# Patient Record
Sex: Female | Born: 1990 | Race: White | Hispanic: No | Marital: Single | Smoking: Former smoker
Health system: Southern US, Community
[De-identification: ages and names within clinical notes are randomized; demographics above are authoritative.]

## PROBLEM LIST (undated history)

## (undated) ENCOUNTER — Inpatient Hospital Stay (HOSPITAL_COMMUNITY): Payer: Self-pay

## (undated) DIAGNOSIS — O26872 Cervical shortening, second trimester: Secondary | ICD-10-CM

## (undated) DIAGNOSIS — Z789 Other specified health status: Secondary | ICD-10-CM

## (undated) HISTORY — PX: TONSILLECTOMY: SUR1361

## (undated) HISTORY — DX: Cervical shortening, second trimester: O26.872

## (undated) HISTORY — DX: Other specified health status: Z78.9

---

## 2016-04-13 HISTORY — PX: DILATION AND CURETTAGE OF UTERUS: SHX78

## 2017-07-04 LAB — OB RESULTS CONSOLE ANTIBODY SCREEN: Antibody Screen: NEGATIVE

## 2017-07-04 LAB — OB RESULTS CONSOLE ABO/RH

## 2017-07-04 LAB — OB RESULTS CONSOLE PLATELET COUNT: PLATELETS: 296

## 2017-07-04 LAB — OB RESULTS CONSOLE GC/CHLAMYDIA
Chlamydia: NEGATIVE
Gonorrhea: NEGATIVE

## 2017-07-04 LAB — OB RESULTS CONSOLE HIV ANTIBODY (ROUTINE TESTING): HIV: NONREACTIVE

## 2017-07-04 LAB — OB RESULTS CONSOLE HGB/HCT, BLOOD
HCT: 37
Hemoglobin: 12.7

## 2017-07-04 LAB — OB RESULTS CONSOLE HEPATITIS B SURFACE ANTIGEN: Hepatitis B Surface Ag: NEGATIVE

## 2017-07-04 LAB — CULTURE, OB URINE: Urine Culture, OB: NEGATIVE

## 2017-07-04 LAB — OB RESULTS CONSOLE RUBELLA ANTIBODY, IGM: Rubella: IMMUNE

## 2017-09-28 ENCOUNTER — Inpatient Hospital Stay (HOSPITAL_COMMUNITY)
Admission: AD | Admit: 2017-09-28 | Discharge: 2017-09-28 | Disposition: A | Discharge status: Home / Self Care | Payer: Self-pay | Source: Ambulatory Visit | Attending: Obstetrics & Gynecology | Admitting: Obstetrics & Gynecology

## 2017-09-28 ENCOUNTER — Encounter (HOSPITAL_COMMUNITY): Payer: Self-pay | Admitting: *Deleted

## 2017-09-28 ENCOUNTER — Other Ambulatory Visit: Payer: Self-pay

## 2017-09-28 ENCOUNTER — Inpatient Hospital Stay (HOSPITAL_COMMUNITY): Payer: Self-pay

## 2017-09-28 DIAGNOSIS — O26892 Other specified pregnancy related conditions, second trimester: Secondary | ICD-10-CM | POA: Insufficient documentation

## 2017-09-28 DIAGNOSIS — O26899 Other specified pregnancy related conditions, unspecified trimester: Secondary | ICD-10-CM

## 2017-09-28 DIAGNOSIS — R109 Unspecified abdominal pain: Secondary | ICD-10-CM | POA: Insufficient documentation

## 2017-09-28 DIAGNOSIS — Z3A22 22 weeks gestation of pregnancy: Secondary | ICD-10-CM | POA: Insufficient documentation

## 2017-09-28 DIAGNOSIS — O26872 Cervical shortening, second trimester: Secondary | ICD-10-CM

## 2017-09-28 LAB — URINALYSIS, ROUTINE W REFLEX MICROSCOPIC
Bilirubin Urine: NEGATIVE
GLUCOSE, UA: NEGATIVE mg/dL
Hgb urine dipstick: NEGATIVE
Ketones, ur: NEGATIVE mg/dL
Nitrite: NEGATIVE
Protein, ur: NEGATIVE mg/dL
SPECIFIC GRAVITY, URINE: 1.017 (ref 1.005–1.030)
pH: 5 (ref 5.0–8.0)

## 2017-09-28 NOTE — MAU Provider Note (Signed)
History     CSN: [id number]  Arrival date and time: 09/28/17 1191   First Provider Initiated Contact with Patient 09/28/17 1925      Chief Complaint  Patient presents with  . Abdominal Pain  . Back Pain  . Pelvic Pain   HPI  Kathlyne Loud is a 27 y.o. G2P0010 at [redacted]w[redacted]d who presents with abdominal pain. She recently moved her from [place] (at Christmas time) & has not started prenatal care locally. Reports getting routine prenatal care in [facility] [place] & denies complications of pregnancy so far.  Current symptoms began 3-4 days ago. Reports lower abdominal "heaviness" that is worse with voiding & abdominal cramping. Rates pain 5/10. Has not treated symptoms. Denies n/v/d, fever/chills, dysuria, hematuria, vaginal bleeding, vaginal discharge, or LOF.   Hx of 1st trimester TAB in 2017, ended up with D&C d/t retained products.   OB History    Gravida Para Term Preterm AB Living   2       1     SAB TAB Ectopic Multiple Live Births     1            History reviewed. No pertinent past medical history.  Past Surgical History:  Procedure Laterality Date  . DILATION AND CURETTAGE OF UTERUS  04/2016   RPOC from TAB    History reviewed. No pertinent family history.  Social History   Tobacco Use  . Smoking status: Former Games developer  . Smokeless tobacco: Former Engineer, water Use Topics  . Alcohol use: No    Frequency: Never  . Drug use: No    Allergies: Not on File  No medications prior to admission.    Review of Systems  Constitutional: Negative.   Gastrointestinal: Positive for abdominal pain. Negative for constipation, diarrhea, nausea and vomiting.  Genitourinary: Positive for pelvic pain. Negative for dysuria, flank pain, frequency, hematuria, vaginal bleeding and vaginal discharge.  Musculoskeletal: Positive for back pain.   Physical Exam   Blood pressure 110/85, pulse 91, temperature 98.6 F (37 C), temperature source Oral, resp. rate 16, height 5\' 1"  (1.549  m), weight 148 lb (67.1 kg), SpO2 100 %.  Physical Exam  Nursing note and vitals reviewed. Constitutional: She is oriented to person, place, and time. She appears well-developed and well-nourished. No distress.  HENT:  Head: Normocephalic and atraumatic.  Eyes: Conjunctivae are normal. Right eye exhibits no discharge. Left eye exhibits no discharge. No scleral icterus.  Neck: Normal range of motion.  Respiratory: Effort normal. No respiratory distress.  GI: Soft. There is no CVA tenderness.  Genitourinary:  Genitourinary Comments: SVE, ext fingertip, soft  Neurological: She is alert and oriented to person, place, and time.  Skin: Skin is warm and dry. She is not diaphoretic.  Psychiatric: She has a normal mood and affect. Her behavior is normal. Judgment and thought content normal.    MAU Course  Procedures Results for orders placed or performed during the hospital encounter of 09/28/17 (from the past 24 hour(s))  Urinalysis, Routine w reflex microscopic     Status: Abnormal   Collection Time: 09/28/17  6:55 PM  Result Value Ref Range   Color, Urine YELLOW YELLOW   APPearance HAZY (A) CLEAR   Specific Gravity, Urine 1.017 1.005 - 1.030   pH 5.0 5.0 - 8.0   Glucose, UA NEGATIVE NEGATIVE mg/dL   Hgb urine dipstick NEGATIVE NEGATIVE   Bilirubin Urine NEGATIVE NEGATIVE   Ketones, ur NEGATIVE NEGATIVE mg/dL   Protein, ur NEGATIVE  NEGATIVE mg/dL   Nitrite NEGATIVE NEGATIVE   Leukocytes, UA SMALL (A) NEGATIVE   RBC / HPF 0-5 0 - 5 RBC/hpf   WBC, UA 0-5 0 - 5 WBC/hpf   Bacteria, UA RARE (A) NONE SEEN   Squamous Epithelial / LPF 6-30 (A) NONE SEEN   Mucus PRESENT    [place] Mfm Ob Transvaginal  Result Date: 09/29/2017 ----------------------------------------------------------------------  OBSTETRICS REPORT                      (Signed Final 09/29/2017 08:41 am) ---------------------------------------------------------------------- Patient Info  ID #:       [id number]                           D.O.B.:  [birth year] (26 yrs)  Name:       Clemon ChambersSAMANTHA Wedel                Visit Date: 09/28/2017 08:29 pm ---------------------------------------------------------------------- Performed By  Performed By:     Ellin SabaSusan M Kennedy        Referred By:      MAU Nursing-                    RDMS                                     MAU/Triage  Attending:        Ledon SnareBrian Brost MD         [facility] ---------------------------------------------------------------------- Orders   #  Description                                 Code   1  US MFM OB TRANSVAGINAL                      [id number]  ----------------------------------------------------------------------   #  Ordered By               Order #        Accession #    Episode #   1  Judeth HornERIN Kasen Adduci            [id number]      [id number]     [id number]  ---------------------------------------------------------------------- Indications   [redacted] weeks gestation of pregnancy                Z3A.22   Abdominal pain in pregnancy                    O99.89  ---------------------------------------------------------------------- OB History  Gravidity:    2         Term:   0  TOP:          1        Living:  0 ---------------------------------------------------------------------- Fetal Evaluation  Num Of Fetuses:     1  Fetal Heart         148  Rate(bpm):  Cardiac Activity:   Observed  Presentation:       Breech  Amniotic Fluid  AFI FV:      Subjectively within normal limits ---------------------------------------------------------------------- Gestational Age  Clinical EDD:  22w 2d  EDD:   01/30/18  Best:          Maudie Mercury 2d     Det. By:  Clinical EDD             EDD:   01/30/18 ---------------------------------------------------------------------- Cervix Uterus Adnexa  Cervix  Length:            2.5  cm.  Measured transvaginally. ---------------------------------------------------------------------- Impression  Singleton intrauterine pregnancy at  22 weeks 2 days  gestation with fetal cardiac activity  Breech presentation  Normal appearing amniotic fluid volume  No sonographic evidence of intrauterine bleed  Cervical length of 2.5 cm ---------------------------------------------------------------------- Recommendations  Follow-up ultrasounds as clinically indicated. ----------------------------------------------------------------------                   Ledon Snare, MD Electronically Signed Final Report   09/29/2017 08:41 am ----------------------------------------------------------------------    MDM FHT 144 Ultrasound shows CL 2.5 cm C/w Dr. Despina Hidden. Will get CL in 1 week.  Assessment and Plan  A: 1. Short cervical length during pregnancy in second trimester   2. [redacted] weeks gestation of pregnancy   3. Abdominal pain affecting pregnancy    P: Discharge home Discussed reasons to return to MAU Outpatient u/s ordered to recheck CL in 1 week Msg to [facility] for prenatal care  Judeth Horn 09/28/2017, 8:10 PM

## 2017-09-28 NOTE — Discharge Instructions (Signed)

## 2017-09-28 NOTE — MAU Note (Signed)
Pt reports for the last 3 days she has had pain on right side, pressure/heaviness  in lower abd

## 2017-09-29 ENCOUNTER — Encounter (HOSPITAL_COMMUNITY): Payer: Self-pay | Admitting: Student

## 2017-09-30 LAB — CULTURE, OB URINE: Culture: NO GROWTH

## 2017-10-01 ENCOUNTER — Inpatient Hospital Stay (HOSPITAL_COMMUNITY)
Admission: AD | Admit: 2017-10-01 | Discharge: 2017-10-01 | Disposition: A | Discharge status: Home / Self Care | Payer: Medicaid Other | Source: Ambulatory Visit | Attending: Obstetrics and Gynecology | Admitting: Obstetrics and Gynecology

## 2017-10-01 DIAGNOSIS — R109 Unspecified abdominal pain: Secondary | ICD-10-CM | POA: Insufficient documentation

## 2017-10-01 DIAGNOSIS — Z3A22 22 weeks gestation of pregnancy: Secondary | ICD-10-CM | POA: Insufficient documentation

## 2017-10-01 DIAGNOSIS — O26892 Other specified pregnancy related conditions, second trimester: Secondary | ICD-10-CM | POA: Diagnosis not present

## 2017-10-01 DIAGNOSIS — Z87891 Personal history of nicotine dependence: Secondary | ICD-10-CM | POA: Insufficient documentation

## 2017-10-01 LAB — URINALYSIS, ROUTINE W REFLEX MICROSCOPIC
BILIRUBIN URINE: NEGATIVE
Glucose, UA: 50 mg/dL — AB
Hgb urine dipstick: NEGATIVE
KETONES UR: NEGATIVE mg/dL
Nitrite: NEGATIVE
Protein, ur: NEGATIVE mg/dL
Specific Gravity, Urine: 1.002 — ABNORMAL LOW (ref 1.005–1.030)
pH: 7 (ref 5.0–8.0)

## 2017-10-01 MED ORDER — OXYTOCIN 40 UNITS IN LACTATED RINGERS INFUSION - SIMPLE MED
INTRAVENOUS | Status: AC
  Filled 2017-10-01: qty 1000

## 2017-10-01 NOTE — Discharge Instructions (Signed)

## 2017-10-01 NOTE — MAU Provider Note (Signed)
History   G2P0010 @ 22.5 wks in with persistant low abd pain that has been going on for 4 days now. Denies contractions or ROM. Denies vag bleeding. Pt verbalizes concern regarding preterm labor.  CSN: [id number]  Arrival date & time 10/01/17  1120   None     Chief Complaint  Patient presents with  . Abdominal Pain    HPI  No past medical history on file.  Past Surgical History:  Procedure Laterality Date  . DILATION AND CURETTAGE OF UTERUS  04/2016   RPOC from TAB    No family history on file.  Social History   Tobacco Use  . Smoking status: Former Games developermoker  . Smokeless tobacco: Former Engineer, waterUser  Substance Use Topics  . Alcohol use: No    Frequency: Never  . Drug use: No    OB History    Gravida Para Term Preterm AB Living   2       1     SAB TAB Ectopic Multiple Live Births     1            Obstetric Comments   G1- first trimester TAB, ended up with D&C d/t retained POC      Review of Systems  Constitutional: Negative.   HENT: Negative.   Eyes: Negative.   Respiratory: Negative.   Cardiovascular: Negative.   Gastrointestinal: Positive for abdominal pain.  Endocrine: Negative.   Genitourinary: Negative.   Musculoskeletal: Negative.   Skin: Negative.   Allergic/Immunologic: Negative.   Neurological: Negative.   Hematological: Negative.   Psychiatric/Behavioral: Negative.     Allergies  Patient has no allergy information on record.  Home Medications    BP (!) 105/44   Pulse 97   Temp 98.7 F (37.1 C)   Resp 16   Physical Exam  Constitutional: She is oriented to person, place, and time. She appears well-developed and well-nourished.  HENT:  Head: Normocephalic.  Eyes: Pupils are equal, round, and reactive to light.  Neck: Normal range of motion.  Cardiovascular: Normal rate, regular rhythm, normal heart sounds and intact distal pulses.  Pulmonary/Chest: Effort normal and breath sounds normal.  Abdominal: Soft. Bowel sounds are normal.   Genitourinary: Vagina normal and uterus normal.  Musculoskeletal: Normal range of motion.  Neurological: She is alert and oriented to person, place, and time. She has normal reflexes.  Skin: Skin is warm and dry.  Psychiatric: She has a normal mood and affect. Her behavior is normal. Judgment and thought content normal.    MAU Course  Procedures (including critical care time)  Labs Reviewed  URINALYSIS, ROUTINE W REFLEX MICROSCOPIC   No results found.   1. Abdominal pain in pregnancy, second trimester       MDM  VSS, FHR 155 st and reg with doppler. SVE firm/cl/post/high. Discussed normal discomforts of preg with pt. She was reassured. Will d/c home

## 2017-10-01 NOTE — MAU Note (Signed)
Patient had returned to MAU with right side abdominal pain was evaluated for this on Wednesday, denies vaginal bleeding, rates pain 4/10

## 2017-10-05 ENCOUNTER — Ambulatory Visit: Payer: Self-pay | Admitting: General Practice

## 2017-10-05 ENCOUNTER — Ambulatory Visit (HOSPITAL_COMMUNITY)
Admission: RE | Admit: 2017-10-05 | Discharge: 2017-10-05 | Disposition: A | Discharge status: Home / Self Care | Payer: Self-pay | Source: Ambulatory Visit | Attending: Student | Admitting: Student

## 2017-10-05 DIAGNOSIS — O26872 Cervical shortening, second trimester: Secondary | ICD-10-CM | POA: Insufficient documentation

## 2017-10-05 DIAGNOSIS — O26899 Other specified pregnancy related conditions, unspecified trimester: Secondary | ICD-10-CM | POA: Insufficient documentation

## 2017-10-05 DIAGNOSIS — R109 Unspecified abdominal pain: Secondary | ICD-10-CM | POA: Insufficient documentation

## 2017-10-05 DIAGNOSIS — Z3A23 23 weeks gestation of pregnancy: Secondary | ICD-10-CM | POA: Insufficient documentation

## 2017-10-05 DIAGNOSIS — Z3A22 22 weeks gestation of pregnancy: Secondary | ICD-10-CM

## 2017-10-05 MED ORDER — PROGESTERONE MICRONIZED 200 MG PO CAPS: 200.0000 mg | ORAL_CAPSULE | Freq: Every day | ORAL | 6 refills | Status: DC

## 2017-10-05 NOTE — Progress Notes (Signed)
Patient came down from MFM for results. Reviewed ultrasound results with patient. Per Dr Earlene Plateravis, patient needs new OB appt asap & start prometrium 200mg  vaginally each night. Med ordered and patient informed. Told patient to make new OB appt here as soon as possible. Patient verbalized understanding to all & had no questions

## 2017-10-06 ENCOUNTER — Encounter: Payer: Self-pay | Admitting: Student

## 2017-10-06 DIAGNOSIS — O26872 Cervical shortening, second trimester: Secondary | ICD-10-CM | POA: Insufficient documentation

## 2017-10-06 HISTORY — DX: Cervical shortening, second trimester: O26.872

## 2017-10-06 NOTE — Progress Notes (Signed)
I have reviewed this chart and agree with the RN/CMA assessment and management.    K. Meryl Davis, M.D. Attending Obstetrician & Gynecologist, [facility], [facility]  

## 2017-10-10 ENCOUNTER — Encounter: Payer: Self-pay | Admitting: Obstetrics & Gynecology

## 2017-10-10 ENCOUNTER — Ambulatory Visit (INDEPENDENT_AMBULATORY_CARE_PROVIDER_SITE_OTHER): Payer: Self-pay | Admitting: Obstetrics & Gynecology

## 2017-10-10 ENCOUNTER — Other Ambulatory Visit: Payer: Self-pay

## 2017-10-10 VITALS — BP 117/55 | HR 82 | Wt 151.3 lb

## 2017-10-10 DIAGNOSIS — O26872 Cervical shortening, second trimester: Secondary | ICD-10-CM

## 2017-10-10 DIAGNOSIS — O0992 Supervision of high risk pregnancy, unspecified, second trimester: Secondary | ICD-10-CM

## 2017-10-10 DIAGNOSIS — O0990 Supervision of high risk pregnancy, unspecified, unspecified trimester: Secondary | ICD-10-CM

## 2017-10-10 NOTE — Progress Notes (Signed)
  Subjective:    Wendy Williamson is a G2P0010 4727w0d being seen today for her first obstetrical visit.  Her obstetrical history is significant for short cervix. Patient does intend to breast feed. Pregnancy history fully reviewed.  Patient reports occasional contractions.  Vitals:   10/10/17 1109  BP: (!) 117/55  Pulse: 82  Weight: 151 lb 4.8 oz (68.6 kg)    HISTORY: OB History  Gravida Para Term Preterm AB Living  2       1    SAB TAB Ectopic Multiple Live Births    1          # Outcome Date GA Lbr Len/2nd Weight Sex Delivery Anes PTL Lv  2 Current           1 TAB 04/2016            Obstetric Comments  G1- first trimester TAB, ended up with D&C d/t retained POC   Past Medical History:  Diagnosis Date  . Medical history non-contributory    Past Surgical History:  Procedure Laterality Date  . DILATION AND CURETTAGE OF UTERUS  04/2016   RPOC from TAB  . TONSILLECTOMY     Family History  Problem Relation Age of Onset  . Mitral valve prolapse Mother   . Other Mother   . Mitral valve prolapse Maternal Aunt      Exam    Uterus:     Pelvic Exam:    Perineum: No Hemorrhoids   Vulva: normal            no lesions   Adnexa: not evaluated   Bony Pelvis: average       Skin: normal coloration and turgor, no rashes    Neurologic: oriented, normal mood   Extremities: normal strength, tone, and muscle mass   HEENT PERRLA   Mouth/Teeth dental hygiene good   Neck supple   Cardiovascular: regular rate and rhythm   Respiratory:  appears well, vitals normal, no respiratory distress, acyanotic, normal RR   Abdomen: gravid   Urinary: urethral meatus normal      Assessment:    Pregnancy: G2P0010 Patient Active Problem List   Diagnosis Date Noted  . Supervision of high risk pregnancy, antepartum 10/10/2017  . Short cervix during pregnancy in second trimester 10/06/2017        Plan:     Initial labs drawn. Prenatal vitamins. Problem list reviewed and  updated. Genetic Screening discussed too late  .  Ultrasound discussed; fetal survey: ordered.  Follow up in 2 weeks. 50% of 30 min visit spent on counseling and coordination of care.  Short cervix, continue Prometrium  Prenatal record requested Scheryl DarterJames Arnold 10/10/2017

## 2017-10-10 NOTE — Patient Instructions (Signed)
Second Trimester of Pregnancy The second trimester is from week 13 through week 28, month 4 through 6. This is often the time in pregnancy that you feel your best. Often times, morning sickness has lessened or quit. You may have more energy, and you may get hungry more often. Your unborn baby (fetus) is growing rapidly. At the end of the sixth month, he or she is about 9 inches long and weighs about 1 pounds. You will likely feel the baby move (quickening) between 18 and 20 weeks of pregnancy. Follow these instructions at home:  Avoid all smoking, herbs, and alcohol. Avoid drugs not approved by your doctor.  Do not use any tobacco products, including cigarettes, chewing tobacco, and electronic cigarettes. If you need help quitting, ask your doctor. You may get counseling or other support to help you quit.  Only take medicine as told by your doctor. Some medicines are safe and some are not during pregnancy.  Exercise only as told by your doctor. Stop exercising if you start having cramps.  Eat regular, healthy meals.  Wear a good support bra if your breasts are tender.  Do not use hot tubs, steam rooms, or saunas.  Wear your seat belt when driving.  Avoid raw meat, uncooked cheese, and liter boxes and soil used by cats.  Take your prenatal vitamins.  Take 1500-2000 milligrams of calcium daily starting at the 20th week of pregnancy until you deliver your baby.  Try taking medicine that helps you poop (stool softener) as needed, and if your doctor approves. Eat more fiber by eating fresh fruit, vegetables, and whole grains. Drink enough fluids to keep your pee (urine) clear or pale yellow.  Take warm water baths (sitz baths) to soothe pain or discomfort caused by hemorrhoids. Use hemorrhoid cream if your doctor approves.  If you have puffy, bulging veins (varicose veins), wear support hose. Raise (elevate) your feet for 15 minutes, 3-4 times a day. Limit salt in your diet.  Avoid heavy  lifting, wear low heals, and sit up straight.  Rest with your legs raised if you have leg cramps or low back pain.  Visit your dentist if you have not gone during your pregnancy. Use a soft toothbrush to brush your teeth. Be gentle when you floss.  You can have sex (intercourse) unless your doctor tells you not to.  Go to your doctor visits. Get help if:  You feel dizzy.  You have mild cramps or pressure in your lower belly (abdomen).  You have a nagging pain in your belly area.  You continue to feel sick to your stomach (nauseous), throw up (vomit), or have watery poop (diarrhea).  You have bad smelling fluid coming from your vagina.  You have pain with peeing (urination). Get help right away if:  You have a fever.  You are leaking fluid from your vagina.  You have spotting or bleeding from your vagina.  You have severe belly cramping or pain.  You lose or gain weight rapidly.  You have trouble catching your breath and have chest pain.  You notice sudden or extreme puffiness (swelling) of your face, hands, ankles, feet, or legs.  You have not felt the baby move in over an hour.  You have severe headaches that do not go away with medicine.  You have vision changes. This information is not intended to replace advice given to you by your health care provider. Make sure you discuss any questions you have with your health care   provider. Document Released: 11/24/2009 Document Revised: 02/05/2016 Document Reviewed: 10/31/2012 Elsevier Interactive Patient Education  2017 Elsevier Inc.  

## 2017-10-12 LAB — URINE CULTURE, OB REFLEX

## 2017-10-12 LAB — CULTURE, OB URINE

## 2017-10-13 ENCOUNTER — Encounter: Payer: Self-pay | Admitting: Obstetrics & Gynecology

## 2017-10-14 ENCOUNTER — Ambulatory Visit (HOSPITAL_COMMUNITY)
Admission: RE | Admit: 2017-10-14 | Discharge: 2017-10-14 | Disposition: A | Discharge status: Home / Self Care | Payer: Medicaid Other | Source: Ambulatory Visit | Attending: Obstetrics & Gynecology | Admitting: Obstetrics & Gynecology

## 2017-10-14 ENCOUNTER — Encounter (HOSPITAL_COMMUNITY): Payer: Self-pay

## 2017-10-14 DIAGNOSIS — Z363 Encounter for antenatal screening for malformations: Secondary | ICD-10-CM | POA: Diagnosis not present

## 2017-10-14 DIAGNOSIS — O26872 Cervical shortening, second trimester: Secondary | ICD-10-CM | POA: Diagnosis not present

## 2017-10-14 DIAGNOSIS — Z3A24 24 weeks gestation of pregnancy: Secondary | ICD-10-CM | POA: Insufficient documentation

## 2017-10-14 DIAGNOSIS — O0992 Supervision of high risk pregnancy, unspecified, second trimester: Secondary | ICD-10-CM | POA: Diagnosis not present

## 2017-10-17 ENCOUNTER — Other Ambulatory Visit (HOSPITAL_COMMUNITY): Payer: Self-pay | Admitting: *Deleted

## 2017-10-17 DIAGNOSIS — O26879 Cervical shortening, unspecified trimester: Secondary | ICD-10-CM

## 2017-10-21 ENCOUNTER — Other Ambulatory Visit (HOSPITAL_COMMUNITY): Payer: Self-pay | Admitting: Maternal and Fetal Medicine

## 2017-10-21 ENCOUNTER — Encounter (HOSPITAL_COMMUNITY): Payer: Self-pay

## 2017-10-21 ENCOUNTER — Ambulatory Visit (HOSPITAL_COMMUNITY)
Admission: RE | Admit: 2017-10-21 | Discharge: 2017-10-21 | Disposition: A | Discharge status: Home / Self Care | Payer: Medicaid Other | Source: Ambulatory Visit | Attending: Obstetrics & Gynecology | Admitting: Obstetrics & Gynecology

## 2017-10-21 DIAGNOSIS — O26872 Cervical shortening, second trimester: Secondary | ICD-10-CM

## 2017-10-21 DIAGNOSIS — O26879 Cervical shortening, unspecified trimester: Secondary | ICD-10-CM

## 2017-10-21 DIAGNOSIS — Z3A25 25 weeks gestation of pregnancy: Secondary | ICD-10-CM | POA: Diagnosis not present

## 2017-10-21 DIAGNOSIS — Z3686 Encounter for antenatal screening for cervical length: Secondary | ICD-10-CM | POA: Diagnosis not present

## 2017-10-24 ENCOUNTER — Encounter: Payer: Self-pay | Admitting: Family Medicine

## 2017-10-26 ENCOUNTER — Encounter: Payer: Self-pay | Admitting: Obstetrics and Gynecology

## 2017-10-26 ENCOUNTER — Ambulatory Visit (INDEPENDENT_AMBULATORY_CARE_PROVIDER_SITE_OTHER): Payer: Self-pay | Admitting: Obstetrics and Gynecology

## 2017-10-26 VITALS — BP 132/60 | HR 87 | Wt 158.4 lb

## 2017-10-26 DIAGNOSIS — O26872 Cervical shortening, second trimester: Secondary | ICD-10-CM

## 2017-10-26 DIAGNOSIS — O0990 Supervision of high risk pregnancy, unspecified, unspecified trimester: Secondary | ICD-10-CM

## 2017-10-26 NOTE — Patient Instructions (Signed)

## 2017-10-26 NOTE — Progress Notes (Signed)
   PRENATAL VISIT NOTE  Subjective:  Wendy ChambersSamantha Williamson is a 27 y.o. G2P0010 at 3556w2d being seen today for ongoing prenatal care.  She is currently monitored for the following issues for this high-risk pregnancy and has Short cervix during pregnancy in second trimester and Supervision of high risk pregnancy, antepartum on their problem list.  Patient reports no complaints.  Contractions: Not present. Vag. Bleeding: None.  Movement: Present. Denies leaking of fluid.   The following portions of the patient's history were reviewed and updated as appropriate: allergies, current medications, past family history, past medical history, past social history, past surgical history and problem list. Problem list updated.  Objective:   Vitals:   10/26/17 1428  BP: 132/60  Pulse: 87  Weight: 158 lb 6.4 oz (71.8 kg)    Fetal Status: Fetal Heart Rate (bpm): 145   Movement: Present     General:  Alert, oriented and cooperative. Patient is in no acute distress.  Skin: Skin is warm and dry. No rash noted.   Cardiovascular: Normal heart rate noted  Respiratory: Normal respiratory effort, no problems with respiration noted  Abdomen: Soft, gravid, appropriate for gestational age.  Pain/Pressure: Present     Pelvic: Cervical exam deferred        Extremities: Normal range of motion.  Edema: None  Mental Status:  Normal mood and affect. Normal behavior. Normal judgment and thought content.   Assessment and Plan:  Pregnancy: G2P0010 at 6456w2d  1. Short cervix during pregnancy in second trimester Remains stable at 2 cm On vaginal prometrium  2. Supervision of high risk pregnancy, antepartum Patient previously signed release of records, still have not received prenatal info Will fax release of records again   Preterm labor symptoms and general obstetric precautions including but not limited to vaginal bleeding, contractions, leaking of fluid and fetal movement were reviewed in detail with the  patient. Please refer to After Visit Summary for other counseling recommendations.  Return in about 2 weeks (around 11/09/2017) for 2 hr GTT, OB visit.   Conan BowensKelly M Davis, MD

## 2017-10-28 ENCOUNTER — Ambulatory Visit (HOSPITAL_COMMUNITY)
Admission: RE | Admit: 2017-10-28 | Discharge: 2017-10-28 | Disposition: A | Discharge status: Home / Self Care | Payer: Medicaid Other | Source: Ambulatory Visit | Attending: Obstetrics & Gynecology | Admitting: Obstetrics & Gynecology

## 2017-10-28 ENCOUNTER — Other Ambulatory Visit (HOSPITAL_COMMUNITY): Payer: Self-pay | Admitting: Maternal and Fetal Medicine

## 2017-10-28 ENCOUNTER — Encounter (HOSPITAL_COMMUNITY): Payer: Self-pay

## 2017-10-28 DIAGNOSIS — O26872 Cervical shortening, second trimester: Secondary | ICD-10-CM | POA: Diagnosis present

## 2017-10-28 DIAGNOSIS — Z3A26 26 weeks gestation of pregnancy: Secondary | ICD-10-CM

## 2017-10-28 DIAGNOSIS — O26879 Cervical shortening, unspecified trimester: Secondary | ICD-10-CM

## 2017-10-28 DIAGNOSIS — Z3686 Encounter for antenatal screening for cervical length: Secondary | ICD-10-CM

## 2017-10-31 ENCOUNTER — Other Ambulatory Visit (HOSPITAL_COMMUNITY): Payer: Self-pay | Admitting: *Deleted

## 2017-10-31 ENCOUNTER — Ambulatory Visit (HOSPITAL_COMMUNITY)
Admission: RE | Admit: 2017-10-31 | Discharge: 2017-10-31 | Disposition: A | Discharge status: Home / Self Care | Payer: Medicaid Other | Source: Ambulatory Visit | Attending: Obstetrics & Gynecology | Admitting: Obstetrics & Gynecology

## 2017-10-31 DIAGNOSIS — Z3A00 Weeks of gestation of pregnancy not specified: Secondary | ICD-10-CM | POA: Diagnosis not present

## 2017-10-31 DIAGNOSIS — O26872 Cervical shortening, second trimester: Secondary | ICD-10-CM | POA: Insufficient documentation

## 2017-10-31 DIAGNOSIS — O26879 Cervical shortening, unspecified trimester: Secondary | ICD-10-CM

## 2017-10-31 MED ORDER — BETAMETHASONE SOD PHOS & ACET 6 (3-3) MG/ML IJ SUSP
12.0000 mg | Freq: Once | INTRAMUSCULAR | Status: AC
  Administered 2017-10-31: 12 mg via INTRAMUSCULAR
  Filled 2017-10-31: qty 2

## 2017-11-01 ENCOUNTER — Ambulatory Visit (HOSPITAL_COMMUNITY)
Admission: RE | Admit: 2017-11-01 | Discharge: 2017-11-01 | Disposition: A | Discharge status: Home / Self Care | Payer: Medicaid Other | Source: Ambulatory Visit | Attending: Obstetrics & Gynecology | Admitting: Obstetrics & Gynecology

## 2017-11-01 DIAGNOSIS — Z3490 Encounter for supervision of normal pregnancy, unspecified, unspecified trimester: Secondary | ICD-10-CM | POA: Insufficient documentation

## 2017-11-01 MED ORDER — BETAMETHASONE SOD PHOS & ACET 6 (3-3) MG/ML IJ SUSP
12.0000 mg | Freq: Once | INTRAMUSCULAR | Status: AC
  Administered 2017-11-01: 12 mg via INTRAMUSCULAR
  Filled 2017-11-01: qty 2

## 2017-11-02 ENCOUNTER — Encounter: Payer: Self-pay | Admitting: *Deleted

## 2017-11-02 ENCOUNTER — Encounter: Payer: Self-pay | Admitting: Obstetrics and Gynecology

## 2017-11-10 ENCOUNTER — Encounter: Payer: Self-pay | Admitting: Obstetrics and Gynecology

## 2017-11-11 ENCOUNTER — Other Ambulatory Visit: Payer: Medicaid Other

## 2017-11-11 ENCOUNTER — Encounter: Payer: Self-pay | Admitting: Obstetrics and Gynecology

## 2017-11-11 ENCOUNTER — Other Ambulatory Visit (HOSPITAL_COMMUNITY): Payer: Self-pay | Admitting: Maternal and Fetal Medicine

## 2017-11-11 ENCOUNTER — Ambulatory Visit (INDEPENDENT_AMBULATORY_CARE_PROVIDER_SITE_OTHER): Payer: Medicaid Other | Admitting: Obstetrics and Gynecology

## 2017-11-11 ENCOUNTER — Ambulatory Visit (HOSPITAL_COMMUNITY)
Admission: RE | Admit: 2017-11-11 | Discharge: 2017-11-11 | Disposition: A | Discharge status: Home / Self Care | Payer: Medicaid Other | Source: Ambulatory Visit | Attending: Obstetrics & Gynecology | Admitting: Obstetrics & Gynecology

## 2017-11-11 ENCOUNTER — Ambulatory Visit (HOSPITAL_COMMUNITY): Payer: Self-pay

## 2017-11-11 ENCOUNTER — Encounter (HOSPITAL_COMMUNITY): Payer: Self-pay

## 2017-11-11 VITALS — BP 105/58 | HR 92

## 2017-11-11 DIAGNOSIS — O26873 Cervical shortening, third trimester: Secondary | ICD-10-CM

## 2017-11-11 DIAGNOSIS — Z3A28 28 weeks gestation of pregnancy: Secondary | ICD-10-CM

## 2017-11-11 DIAGNOSIS — O0990 Supervision of high risk pregnancy, unspecified, unspecified trimester: Secondary | ICD-10-CM

## 2017-11-11 DIAGNOSIS — Z3686 Encounter for antenatal screening for cervical length: Secondary | ICD-10-CM | POA: Diagnosis not present

## 2017-11-11 DIAGNOSIS — O26879 Cervical shortening, unspecified trimester: Secondary | ICD-10-CM

## 2017-11-11 DIAGNOSIS — Z362 Encounter for other antenatal screening follow-up: Secondary | ICD-10-CM | POA: Insufficient documentation

## 2017-11-11 DIAGNOSIS — O26872 Cervical shortening, second trimester: Secondary | ICD-10-CM

## 2017-11-11 DIAGNOSIS — O0992 Supervision of high risk pregnancy, unspecified, second trimester: Secondary | ICD-10-CM

## 2017-11-11 NOTE — Progress Notes (Signed)
   PRENATAL VISIT NOTE  Subjective:  Wendy Williamson is a 27 y.o. G2P0010 at 4614w4d being seen today for ongoing prenatal care.  She is currently monitored for the following issues for this high-risk pregnancy and has Short cervix during pregnancy in second trimester and Supervision of high risk pregnancy, antepartum on their problem list. Works PT as Theatre stage managerhostess. Occasional sexually active with some pelvic pressure.  Patient reports no bleeding, no contractions, no cramping, no leaking and slight vaginal pressure.   .  .   .   The following portions of the patient's history were reviewed and updated as appropriate: allergies, current medications, past family history, past medical history, past social history, past surgical history and problem list. Problem list updated.  Objective:   Vitals:   11/11/17 0832  BP: (!) 105/58  Pulse: 92    Fetal Status:           General:  Alert, oriented and cooperative. Patient is in no acute distress.  Skin: Skin is warm and dry. No rash noted.   Cardiovascular: Normal heart rate noted  Respiratory: Normal respiratory effort, no problems with respiration noted  Abdomen: Soft, gravid, appropriate for gestational age.        Pelvic: Cervical exam performed      soft, posterior, closed, 1.5cm long, cephalic -2  Extremities: Normal range of motion.     Mental Status:  Normal mood and affect. Normal behavior. Normal judgment and thought content.   Assessment and Plan:  Pregnancy: G2P0010 at 3114w4d  1. Supervision of high risk pregnancy, antepartum  - CBC - HIV antibody - RPR  -2hr glucola  2. Cervical shortening    -s/p BMZ    -continue vaginal progesterone   - For US today to f/u anatomy and check CL  Preterm labor symptoms and general obstetric precautions including but not limited to vaginal bleeding, contractions, leaking of fluid and fetal movement were reviewed in detail with the patient. Please refer to After Visit Summary for other  counseling recommendations.  Return in about 2 weeks (around 11/25/2017).   Caren Griffinseirdre Poe, CNM

## 2017-11-11 NOTE — Patient Instructions (Signed)
Third Trimester of Pregnancy The third trimester is from week 28 through week 40 (months 7 through 9). The third trimester is a time when the unborn baby (fetus) is growing rapidly. At the end of the ninth month, the fetus is about 20 inches in length and weighs 6-10 pounds. Body changes during your third trimester Your body will continue to go through many changes during pregnancy. The changes vary from woman to woman. During the third trimester:  Your weight will continue to increase. You can expect to gain 25-35 pounds (11-16 kg) by the end of the pregnancy.  You may begin to get stretch marks on your hips, abdomen, and breasts.  You may urinate more often because the fetus is moving lower into your pelvis and pressing on your bladder.  You may develop or continue to have heartburn. This is caused by increased hormones that slow down muscles in the digestive tract.  You may develop or continue to have constipation because increased hormones slow digestion and cause the muscles that push waste through your intestines to relax.  You may develop hemorrhoids. These are swollen veins (varicose veins) in the rectum that can itch or be painful.  You may develop swollen, bulging veins (varicose veins) in your legs.  You may have increased body aches in the pelvis, back, or thighs. This is due to weight gain and increased hormones that are relaxing your joints.  You may have changes in your hair. These can include thickening of your hair, rapid growth, and changes in texture. Some women also have hair loss during or after pregnancy, or hair that feels dry or thin. Your hair will most likely return to normal after your baby is born.  Your breasts will continue to grow and they will continue to become tender. A yellow fluid (colostrum) may leak from your breasts. This is the first milk you are producing for your baby.  Your belly button may stick out.  You may notice more swelling in your hands,  face, or ankles.  You may have increased tingling or numbness in your hands, arms, and legs. The skin on your belly may also feel numb.  You may feel short of breath because of your expanding uterus.  You may have more problems sleeping. This can be caused by the size of your belly, increased need to urinate, and an increase in your body's metabolism.  You may notice the fetus "dropping," or moving lower in your abdomen (lightening).  You may have increased vaginal discharge.  You may notice your joints feel loose and you may have pain around your pelvic bone.  What to expect at prenatal visits You will have prenatal exams every 2 weeks until week 36. Then you will have weekly prenatal exams. During a routine prenatal visit:  You will be weighed to make sure you and the baby are growing normally.  Your blood pressure will be taken.  Your abdomen will be measured to track your baby's growth.  The fetal heartbeat will be listened to.  Any test results from the previous visit will be discussed.  You may have a cervical check near your due date to see if your cervix has softened or thinned (effaced).  You will be tested for Group B streptococcus. This happens between 35 and 37 weeks.  Your health care provider may ask you:  What your birth plan is.  How you are feeling.  If you are feeling the baby move.  If you have had   any abnormal symptoms, such as leaking fluid, bleeding, severe headaches, or abdominal cramping.  If you are using any tobacco products, including cigarettes, chewing tobacco, and electronic cigarettes.  If you have any questions.  Other tests or screenings that may be performed during your third trimester include:  Blood tests that check for low iron levels (anemia).  Fetal testing to check the health, activity level, and growth of the fetus. Testing is done if you have certain medical conditions or if there are problems during the  pregnancy.  Nonstress test (NST). This test checks the health of your baby to make sure there are no signs of problems, such as the baby not getting enough oxygen. During this test, a belt is placed around your belly. The baby is made to move, and its heart rate is monitored during movement.  What is false labor? False labor is a condition in which you feel small, irregular tightenings of the muscles in the womb (contractions) that usually go away with rest, changing position, or drinking water. These are called Braxton Hicks contractions. Contractions may last for hours, days, or even weeks before true labor sets in. If contractions come at regular intervals, become more frequent, increase in intensity, or become painful, you should see your health care provider. What are the signs of labor?  Abdominal cramps.  Regular contractions that start at 10 minutes apart and become stronger and more frequent with time.  Contractions that start on the top of the uterus and spread down to the lower abdomen and back.  Increased pelvic pressure and dull back pain.  A watery or bloody mucus discharge that comes from the vagina.  Leaking of amniotic fluid. This is also known as your "water breaking." It could be a slow trickle or a gush. Let your health care provider know if it has a color or strange odor. If you have any of these signs, call your health care provider right away, even if it is before your due date. Follow these instructions at home: Medicines  Follow your health care provider's instructions regarding medicine use. Specific medicines may be either safe or unsafe to take during pregnancy.  Take a prenatal vitamin that contains at least 600 micrograms (mcg) of folic acid.  If you develop constipation, try taking a stool softener if your health care provider approves. Eating and drinking  Eat a balanced diet that includes fresh fruits and vegetables, whole grains, good sources of protein  such as meat, eggs, or tofu, and low-fat dairy. Your health care provider will help you determine the amount of weight gain that is right for you.  Avoid raw meat and uncooked cheese. These carry germs that can cause birth defects in the baby.  If you have low calcium intake from food, talk to your health care provider about whether you should take a daily calcium supplement.  Eat four or five small meals rather than three large meals a day.  Limit foods that are high in fat and processed sugars, such as fried and sweet foods.  To prevent constipation: ? Drink enough fluid to keep your urine clear or pale yellow. ? Eat foods that are high in fiber, such as fresh fruits and vegetables, whole grains, and beans. Activity  Exercise only as directed by your health care provider. Most women can continue their usual exercise routine during pregnancy. Try to exercise for 30 minutes at least 5 days a week. Stop exercising if you experience uterine contractions.  Avoid heavy   lifting.  Do not exercise in extreme heat or humidity, or at high altitudes.  Wear low-heel, comfortable shoes.  Practice good posture.  You may continue to have sex unless your health care provider tells you otherwise. Relieving pain and discomfort  Take frequent breaks and rest with your legs elevated if you have leg cramps or low back pain.  Take warm sitz baths to soothe any pain or discomfort caused by hemorrhoids. Use hemorrhoid cream if your health care provider approves.  Wear a good support bra to prevent discomfort from breast tenderness.  If you develop varicose veins: ? Wear support pantyhose or compression stockings as told by your healthcare provider. ? Elevate your feet for 15 minutes, 3-4 times a day. Prenatal care  Write down your questions. Take them to your prenatal visits.  Keep all your prenatal visits as told by your health care provider. This is important. Safety  Wear your seat belt at  all times when driving.  Make a list of emergency phone numbers, including numbers for family, friends, the hospital, and police and fire departments. General instructions  Avoid cat litter boxes and soil used by cats. These carry germs that can cause birth defects in the baby. If you have a cat, ask someone to clean the litter box for you.  Do not travel far distances unless it is absolutely necessary and only with the approval of your health care provider.  Do not use hot tubs, steam rooms, or saunas.  Do not drink alcohol.  Do not use any products that contain nicotine or tobacco, such as cigarettes and e-cigarettes. If you need help quitting, ask your health care provider.  Do not use any medicinal herbs or unprescribed drugs. These chemicals affect the formation and growth of the baby.  Do not douche or use tampons or scented sanitary pads.  Do not cross your legs for long periods of time.  To prepare for the arrival of your baby: ? Take prenatal classes to understand, practice, and ask questions about labor and delivery. ? Make a trial run to the hospital. ? Visit the hospital and tour the maternity area. ? Arrange for maternity or paternity leave through employers. ? Arrange for family and friends to take care of pets while you are in the hospital. ? Purchase a rear-facing car seat and make sure you know how to install it in your car. ? Pack your hospital bag. ? Prepare the baby's nursery. Make sure to remove all pillows and stuffed animals from the baby's crib to prevent suffocation.  Visit your dentist if you have not gone during your pregnancy. Use a soft toothbrush to brush your teeth and be gentle when you floss. Contact a health care provider if:  You are unsure if you are in labor or if your water has broken.  You become dizzy.  You have mild pelvic cramps, pelvic pressure, or nagging pain in your abdominal area.  You have lower back pain.  You have persistent  nausea, vomiting, or diarrhea.  You have an unusual or bad smelling vaginal discharge.  You have pain when you urinate. Get help right away if:  Your water breaks before 37 weeks.  You have regular contractions less than 5 minutes apart before 37 weeks.  You have a fever.  You are leaking fluid from your vagina.  You have spotting or bleeding from your vagina.  You have severe abdominal pain or cramping.  You have rapid weight loss or weight gain.    You have shortness of breath with chest pain.  You notice sudden or extreme swelling of your face, hands, ankles, feet, or legs.  Your baby makes fewer than 10 movements in 2 hours.  You have severe headaches that do not go away when you take medicine.  You have vision changes. Summary  The third trimester is from week 28 through week 40, months 7 through 9. The third trimester is a time when the unborn baby (fetus) is growing rapidly.  During the third trimester, your discomfort may increase as you and your baby continue to gain weight. You may have abdominal, leg, and back pain, sleeping problems, and an increased need to urinate.  During the third trimester your breasts will keep growing and they will continue to become tender. A yellow fluid (colostrum) may leak from your breasts. This is the first milk you are producing for your baby.  False labor is a condition in which you feel small, irregular tightenings of the muscles in the womb (contractions) that eventually go away. These are called Braxton Hicks contractions. Contractions may last for hours, days, or even weeks before true labor sets in.  Signs of labor can include: abdominal cramps; regular contractions that start at 10 minutes apart and become stronger and more frequent with time; watery or bloody mucus discharge that comes from the vagina; increased pelvic pressure and dull back pain; and leaking of amniotic fluid. This information is not intended to replace advice  given to you by your health care provider. Make sure you discuss any questions you have with your health care provider. Document Released: 08/24/2001 Document Revised: 02/05/2016 Document Reviewed: 10/31/2012 Elsevier Interactive Patient Education  2017 Elsevier Inc.  

## 2017-11-11 NOTE — Progress Notes (Signed)
Tdap offered on 11/11/17, pt wants to wait until next visit.

## 2017-11-12 LAB — CBC
HEMOGLOBIN: 11.9 g/dL (ref 11.1–15.9)
Hematocrit: 35.3 % (ref 34.0–46.6)
MCH: 29.5 pg (ref 26.6–33.0)
MCHC: 33.7 g/dL (ref 31.5–35.7)
MCV: 87 fL (ref 79–97)
Platelets: 256 10*3/uL (ref 150–379)
RBC: 4.04 x10E6/uL (ref 3.77–5.28)
RDW: 13.8 % (ref 12.3–15.4)
WBC: 9 10*3/uL (ref 3.4–10.8)

## 2017-11-12 LAB — GLUCOSE TOLERANCE, 2 HOURS W/ 1HR
Glucose, 1 hour: 75 mg/dL (ref 65–179)
Glucose, 2 hour: 97 mg/dL (ref 65–152)
Glucose, Fasting: 69 mg/dL (ref 65–91)

## 2017-11-12 LAB — RPR: RPR Ser Ql: NONREACTIVE

## 2017-11-12 LAB — HIV ANTIBODY (ROUTINE TESTING W REFLEX): HIV Screen 4th Generation wRfx: NONREACTIVE

## 2017-11-23 ENCOUNTER — Encounter: Payer: Self-pay | Admitting: Obstetrics and Gynecology

## 2017-11-29 ENCOUNTER — Ambulatory Visit (INDEPENDENT_AMBULATORY_CARE_PROVIDER_SITE_OTHER): Payer: Medicaid Other | Admitting: Family Medicine

## 2017-11-29 VITALS — BP 117/62 | HR 92 | Wt 164.8 lb

## 2017-11-29 DIAGNOSIS — O26872 Cervical shortening, second trimester: Secondary | ICD-10-CM

## 2017-11-29 DIAGNOSIS — Z23 Encounter for immunization: Secondary | ICD-10-CM

## 2017-11-29 DIAGNOSIS — O0990 Supervision of high risk pregnancy, unspecified, unspecified trimester: Secondary | ICD-10-CM

## 2017-11-29 NOTE — Progress Notes (Signed)
   PRENATAL VISIT NOTE  Subjective:  Wendy Williamson is a 27 y.o. G2P0010 at 4049w1d being seen today for ongoing prenatal care.  She is currently monitored for the following issues for this high-risk pregnancy and has Short cervix during pregnancy in second trimester and Supervision of high risk pregnancy, antepartum on their problem list.  Patient reports no complaints.  Contractions: Not present. Vag. Bleeding: None.  Movement: Present. Denies leaking of fluid.   The following portions of the patient's history were reviewed and updated as appropriate: allergies, current medications, past family history, past medical history, past social history, past surgical history and problem list. Problem list updated.  Objective:   Vitals:   11/29/17 1627  BP: 117/62  Pulse: 92  Weight: 164 lb 12.8 oz (74.8 kg)    Fetal Status: Fetal Heart Rate (bpm):  137   Movement: Present     General:  Alert, oriented and cooperative. Patient is in no acute distress.  Skin: Skin is warm and dry. No rash noted.   Cardiovascular: Normal heart rate noted  Respiratory: Normal respiratory effort, no problems with respiration noted  Abdomen: Soft, gravid, appropriate for gestational age.  Pain/Pressure: Present     Pelvic: Cervical exam deferred        Extremities: Normal range of motion.  Edema: Trace  Mental Status:  Normal mood and affect. Normal behavior. Normal judgment and thought content.   Assessment and Plan:  Pregnancy: G2P0010 at 5449w1d  1. Short cervix during pregnancy in second trimester Continue prometrium  2. Supervision of high risk pregnancy, antepartum   Preterm labor symptoms and general obstetric precautions including but not limited to vaginal bleeding, contractions, leaking of fluid and fetal movement were reviewed in detail with the patient. Please refer to After Visit Summary for other counseling recommendations.  Return in about 2 weeks (around 12/13/2017).   Rolm BookbinderAmber Cerys Winget, DO

## 2017-11-29 NOTE — Patient Instructions (Addendum)
Preterm Labor and Birth Information Pregnancy normally lasts 39-41 weeks. Preterm labor is when labor starts early. It starts before you have been pregnant for 37 whole weeks. What are the risk factors for preterm labor? Preterm labor is more likely to occur in women who:  Have an infection while pregnant.  Have a cervix that is short.  Have gone into preterm labor before.  Have had surgery on their cervix.  Are younger than age 26.  Are older than age 42.  Are African American.  Are pregnant with two or more babies.  Take street drugs while pregnant.  Smoke while pregnant.  Do not gain enough weight while pregnant.  Got pregnant right after another pregnancy.  What are the symptoms of preterm labor? Symptoms of preterm labor include:  Cramps. The cramps may feel like the cramps some women get during their period. The cramps may happen with watery poop (diarrhea).  Pain in the belly (abdomen).  Pain in the lower back.  Regular contractions or tightening. It may feel like your belly is getting tighter.  Pressure in the lower belly that seems to get stronger.  More fluid (discharge) leaking from the vagina. The fluid may be watery or bloody.  Water breaking.  Why is it important to notice signs of preterm labor? Babies who are born early may not be fully developed. They have a higher chance for:  Long-term heart problems.  Long-term lung problems.  Trouble controlling body systems, like breathing.  Bleeding in the brain.  A condition called cerebral palsy.  Learning difficulties.  Death.  These risks are highest for babies who are born before 34 weeks of pregnancy. How is preterm labor treated? Treatment depends on:  How long you were pregnant.  Your condition.  The health of your baby.  Treatment may involve:  Having a stitch (suture) placed in your cervix. When you give birth, your cervix opens so the baby can come out. The stitch keeps the  cervix from opening too soon.  Staying at the hospital.  Taking or getting medicines, such as: ? Hormone medicines. ? Medicines to stop contractions. ? Medicines to help the baby's lungs develop. ? Medicines to prevent your baby from having cerebral palsy.  What should I do if I am in preterm labor? If you think you are going into labor too soon, call your doctor right away. How can I prevent preterm labor?  Do not use any tobacco products. ? Examples of these are cigarettes, chewing tobacco, and e-cigarettes. ? If you need help quitting, ask your doctor.  Do not use street drugs.  Do not use any medicines unless you ask your doctor if they are safe for you.  Talk with your doctor before taking any herbal supplements.  Make sure you gain enough weight.  Watch for infection. If you think you might have an infection, get it checked right away.  If you have gone into preterm labor before, tell your doctor. This information is not intended to replace advice given to you by your health care provider. Make sure you discuss any questions you have with your health care provider. Document Released: 11/26/2008 Document Revised: 02/10/2016 Document Reviewed: 01/21/2016 Elsevier Interactive Patient Education  2018 [employer].   AREA PEDIATRIC/FAMILY PRACTICE PHYSICIANS  [facility] [address] [place], [place] [address] Phone - [phone]   Fax - [phone]  [facility] AMOS [address] [place], [place]  [address] Phone - [phone]  Fax - [phone]  [facility] [address] [place], [place]  [address] Phone - [phone]   Fax - [phone]  [facility] [address] [place], [place]  [address] Phone - [phone]   Fax - [phone]  [facility] [address] [place], [place]  [address] Phone - [phone]   Fax - [phone]  [facility] [address], [address] [place], [place]  [address] Phone - [phone]   Fax - [phone]  [facility] [address] [place], [place]  [address] Phone - [phone]   Fax - [phone]  [facility] [address] [place], [place]  [address] Phone - [phone]   Fax - [phone]  [facility] [address] [place], [place]  [address] Phone - [phone]   Fax - [phone] [facility] [address] [place], [place]  [address] Phone - [phone]   Fax - [phone]  [facility] [address] [place], [place]  [address] Phone - [phone]   Fax - [phone]  [facility] [address], [address] [place], [place]  [address] Phone - [phone]   Fax - [phone]  [facility] [address] [place], [place]  [address] Phone - [phone]   Fax - [phone]  [facility] [address], [address] [place], [place]  [address] Phone - [phone]  [facility] [address] [place], [place]  [address] Phone - [phone]   Fax - [phone]  [facility] [address], [address] [place], [place]  [address] Phone - [phone]   Fax - [phone]  [facility] [address] [place], [place]  [address] Phone - [phone]   Fax - [phone]  [facility] [address] [place], [place]  [address] Phone - [phone]   Fax - [phone]   [facility] [address] [place], [place]  [address] Phone - [phone]   Fax - [phone]  [facility] [address], [address] [place], [place]  [address] Phone - [phone]   Fax - [phone]  DAVID RUBIN [address] [place], [place]  [address] Phone - [phone]   Fax -  [phone]  [facility] [address] [place], [place]  [address] Phone - [phone]   Fax - [phone]  [facility] [address] [place], [place]  [address] Phone - [phone]   Fax - [phone] [facility] [place] [address] [place], [place]  [address] Phone - [phone]   Fax - [phone]  [facility] [place] [address] [place], [place]  [address] Phone - [phone]   Fax - [phone]  [facility] [place] [address] [place], [place]  [address] Phone - [phone]   Fax - [phone]

## 2017-12-07 ENCOUNTER — Encounter: Payer: Self-pay | Admitting: Obstetrics and Gynecology

## 2017-12-09 ENCOUNTER — Encounter: Payer: Self-pay | Admitting: Obstetrics and Gynecology

## 2017-12-12 ENCOUNTER — Inpatient Hospital Stay (HOSPITAL_COMMUNITY)
Admission: AD | Admit: 2017-12-12 | Discharge: 2017-12-12 | Disposition: A | Discharge status: Home / Self Care | Payer: Medicaid Other | Source: Ambulatory Visit | Attending: Obstetrics & Gynecology | Admitting: Obstetrics & Gynecology

## 2017-12-12 ENCOUNTER — Encounter (HOSPITAL_COMMUNITY): Payer: Self-pay | Admitting: *Deleted

## 2017-12-12 DIAGNOSIS — O99283 Endocrine, nutritional and metabolic diseases complicating pregnancy, third trimester: Secondary | ICD-10-CM | POA: Diagnosis not present

## 2017-12-12 DIAGNOSIS — R55 Syncope and collapse: Secondary | ICD-10-CM | POA: Diagnosis present

## 2017-12-12 DIAGNOSIS — Z3A33 33 weeks gestation of pregnancy: Secondary | ICD-10-CM | POA: Insufficient documentation

## 2017-12-12 DIAGNOSIS — Z87891 Personal history of nicotine dependence: Secondary | ICD-10-CM | POA: Diagnosis not present

## 2017-12-12 DIAGNOSIS — O26873 Cervical shortening, third trimester: Secondary | ICD-10-CM | POA: Insufficient documentation

## 2017-12-12 DIAGNOSIS — O26872 Cervical shortening, second trimester: Secondary | ICD-10-CM

## 2017-12-12 DIAGNOSIS — E876 Hypokalemia: Secondary | ICD-10-CM | POA: Insufficient documentation

## 2017-12-12 LAB — CBC
HCT: 32.4 % — ABNORMAL LOW (ref 36.0–46.0)
Hemoglobin: 11.4 g/dL — ABNORMAL LOW (ref 12.0–15.0)
MCH: 29.5 pg (ref 26.0–34.0)
MCHC: 35.2 g/dL (ref 30.0–36.0)
MCV: 83.9 fL (ref 78.0–100.0)
Platelets: 215 10*3/uL (ref 150–400)
RBC: 3.86 MIL/uL — ABNORMAL LOW (ref 3.87–5.11)
RDW: 13.1 % (ref 11.5–15.5)
WBC: 11.4 10*3/uL — ABNORMAL HIGH (ref 4.0–10.5)

## 2017-12-12 LAB — COMPREHENSIVE METABOLIC PANEL
ALT: 13 U/L — ABNORMAL LOW (ref 14–54)
AST: 19 U/L (ref 15–41)
Albumin: 3 g/dL — ABNORMAL LOW (ref 3.5–5.0)
Alkaline Phosphatase: 88 U/L (ref 38–126)
Anion gap: 10 (ref 5–15)
BUN: 8 mg/dL (ref 6–20)
CO2: 20 mmol/L — ABNORMAL LOW (ref 22–32)
Calcium: 9 mg/dL (ref 8.9–10.3)
Chloride: 104 mmol/L (ref 101–111)
Creatinine, Ser: 0.4 mg/dL — ABNORMAL LOW (ref 0.44–1.00)
GFR calc Af Amer: >60 mL/min (ref >60)
GFR calc non Af Amer: >60 mL/min (ref >60)
Glucose, Bld: 84 mg/dL (ref 65–99)
Potassium: 3.1 mmol/L — ABNORMAL LOW (ref 3.5–5.1)
Sodium: 134 mmol/L — ABNORMAL LOW (ref 135–145)
Total Bilirubin: 0.5 mg/dL (ref 0.3–1.2)
Total Protein: 6.3 g/dL — ABNORMAL LOW (ref 6.5–8.1)

## 2017-12-12 LAB — URINALYSIS, ROUTINE W REFLEX MICROSCOPIC
Bilirubin Urine: NEGATIVE
Glucose, UA: NEGATIVE mg/dL
Hgb urine dipstick: NEGATIVE
Ketones, ur: NEGATIVE mg/dL
Nitrite: NEGATIVE
Protein, ur: NEGATIVE mg/dL
Specific Gravity, Urine: 1.014 (ref 1.005–1.030)
pH: 6 (ref 5.0–8.0)

## 2017-12-12 MED ORDER — POTASSIUM CHLORIDE CRYS ER 20 MEQ PO TBCR: 40.0000 meq | EXTENDED_RELEASE_TABLET | Freq: Every day | ORAL | 0 refills | Status: DC

## 2017-12-12 MED ORDER — ACETAMINOPHEN 500 MG PO TABS
1000.0000 mg | ORAL_TABLET | Freq: Once | ORAL | Status: AC
  Administered 2017-12-12: 1000 mg via ORAL
  Filled 2017-12-12: qty 2

## 2017-12-12 MED ORDER — POTASSIUM CHLORIDE CRYS ER 20 MEQ PO TBCR
40.0000 meq | EXTENDED_RELEASE_TABLET | Freq: Once | ORAL | Status: AC
Start: 2017-12-12 — End: 2017-12-12
  Administered 2017-12-12: 40 meq via ORAL
  Filled 2017-12-12: qty 2

## 2017-12-12 NOTE — Discharge Instructions (Signed)

## 2017-12-12 NOTE — MAU Provider Note (Signed)
Chief Complaint:  Near Syncope   First Provider Initiated Contact with Patient 12/12/17 2154      HPI: Wendy ChambersSamantha Williamson is a 27 y.o. G2P0010 at 5933w0dwho presents to maternity admissions reporting "not feeling herself". She reports near syncope episode earlier this afternoon. She reports her left eye felt funny and then she felt like she was going to pass out. She reports laying down and taking a nap then waking up feeling slightly better but with a HA. She rates her HA as 5/10- has not taken any medication for pain. She denies abdominal cramping, vaginal bleeding or discharge. She reports good fetal movement, denies LOF,vaginal itching/burning, urinary symptoms, dizziness, n/v, or fever/chills. She denies having issues with BP- reports her BP usually runs low.    Past Medical History: Past Medical History:  Diagnosis Date  . Medical history non-contributory     Past obstetric history: OB History  Gravida Para Term Preterm AB Living  2       1 0  SAB TAB Ectopic Multiple Live Births    1          # Outcome Date GA Lbr Len/2nd Weight Sex Delivery Anes PTL Lv  2 Current           1 TAB 04/2016            Obstetric Comments  G1- first trimester TAB, ended up with D&C d/t retained POC    Past Surgical History: Past Surgical History:  Procedure Laterality Date  . DILATION AND CURETTAGE OF UTERUS  04/2016   RPOC from TAB  . TONSILLECTOMY      Family History: Family History  Problem Relation Age of Onset  . Mitral valve prolapse Mother   . Other Mother   . Mitral valve prolapse Maternal Aunt     Social History: Social History   Tobacco Use  . Smoking status: Former Games developermoker  . Smokeless tobacco: Never Used  Substance Use Topics  . Alcohol use: No    Frequency: Never    Comment: before pregnancy  . Drug use: No    Allergies: No Known Allergies  Meds:  Medications Prior to Admission  Medication Sig Dispense Refill Last Dose  . acetaminophen (TYLENOL) 500 MG tablet  Take 500 mg by mouth every 6 (six) hours as needed for headache.   Taking  . Prenatal Vit-Fe Fumarate-FA (PRENATAL MULTIVITAMIN) TABS tablet Take 1 tablet by mouth daily at 12 noon.   Taking  . progesterone (PROMETRIUM) 200 MG capsule Place 1 capsule (200 mg total) vaginally daily. 30 capsule 6 Taking    ROS:  Review of Systems  Respiratory: Negative.   Cardiovascular: Negative.   Gastrointestinal: Negative.   Genitourinary: Negative.   Musculoskeletal: Negative.   Neurological: Positive for syncope and headaches. Negative for dizziness, weakness and light-headedness.   I have reviewed patient's Past Medical Hx, Surgical Hx, Family Hx, Social Hx, medications and allergies.   Physical Exam   Patient Vitals for the past 24 hrs:  BP Temp Temp src Pulse Resp Height Weight  12/12/17 2316 107/65 - - - - - -  12/12/17 2135 (!) 123/59 98.4 F (36.9 C) Oral 79 20 5\' 1"  (1.549 m) -  12/12/17 2126 - - - - - - 169 lb 1.3 oz (76.7 kg)   Constitutional: Well-developed, well-nourished female in no acute distress.  Cardiovascular: normal rate Respiratory: normal effort GI: Abd soft, non-tender, gravid appropriate for gestational age.  MS: Extremities nontender, no edema, normal ROM  Neurologic: Alert and oriented x 4.  GU: Neg CVAT. PELVIC EXAM: deferred due to short cervix and no contractions    FHT:  Baseline 125 , moderate variability, accelerations present, no decelerations Contractions: none   Labs: Results for orders placed or performed during the hospital encounter of 12/12/17 (from the past 24 hour(s))  Urinalysis, Routine w reflex microscopic     Status: Abnormal   Collection Time: 12/12/17  9:24 PM  Result Value Ref Range   Color, Urine YELLOW YELLOW   APPearance HAZY (A) CLEAR   Specific Gravity, Urine 1.014 1.005 - 1.030   pH 6.0 5.0 - 8.0   Glucose, UA NEGATIVE NEGATIVE mg/dL   Hgb urine dipstick NEGATIVE NEGATIVE   Bilirubin Urine NEGATIVE NEGATIVE   Ketones, ur  NEGATIVE NEGATIVE mg/dL   Protein, ur NEGATIVE NEGATIVE mg/dL   Nitrite NEGATIVE NEGATIVE   Leukocytes, UA MODERATE (A) NEGATIVE   RBC / HPF 0-5 0 - 5 RBC/hpf   WBC, UA 0-5 0 - 5 WBC/hpf   Bacteria, UA RARE (A) NONE SEEN   Squamous Epithelial / LPF 0-5 (A) NONE SEEN   Mucus PRESENT   CBC     Status: Abnormal   Collection Time: 12/12/17 10:01 PM  Result Value Ref Range   WBC 11.4 (H) 4.0 - 10.5 K/uL   RBC 3.86 (L) 3.87 - 5.11 MIL/uL   Hemoglobin 11.4 (L) 12.0 - 15.0 g/dL   HCT 96.2 (L) 95.2 - 84.1 %   MCV 83.9 78.0 - 100.0 fL   MCH 29.5 26.0 - 34.0 pg   MCHC 35.2 30.0 - 36.0 g/dL   RDW 32.4 40.1 - 02.7 %   Platelets 215 150 - 400 K/uL  Comprehensive metabolic panel     Status: Abnormal   Collection Time: 12/12/17 10:01 PM  Result Value Ref Range   Sodium 134 (L) 135 - 145 mmol/L   Potassium 3.1 (L) 3.5 - 5.1 mmol/L   Chloride 104 101 - 111 mmol/L   CO2 20 (L) 22 - 32 mmol/L   Glucose, Bld 84 65 - 99 mg/dL   BUN 8 6 - 20 mg/dL   Creatinine, Ser 2.53 (L) 0.44 - 1.00 mg/dL   Calcium 9.0 8.9 - 66.4 mg/dL   Total Protein 6.3 (L) 6.5 - 8.1 g/dL   Albumin 3.0 (L) 3.5 - 5.0 g/dL   AST 19 15 - 41 U/L   ALT 13 (L) 14 - 54 U/L   Alkaline Phosphatase 88 38 - 126 U/L   Total Bilirubin 0.5 0.3 - 1.2 mg/dL   GFR calc non Af Amer >60 >60 mL/min   GFR calc Af Amer >60 >60 mL/min   Anion gap 10 5 - 15   O/--/-- (10/22 0000)  MAU Course/MDM: Orders Placed This Encounter  Procedures  . Culture, OB Urine  . CBC  . Comprehensive metabolic panel  . Urinalysis, Routine w reflex microscopic  CBC- WNL, normal range hgb  CMP- showed slight decrease in potassium  Urine culture- pending   Meds ordered this encounter  Medications  . acetaminophen (TYLENOL) tablet 1,000 mg  . potassium chloride SA (K-DUR,KLOR-CON) CR tablet 40 mEq  . potassium chloride SA (K-DUR,KLOR-CON) 20 MEQ tablet    Sig: Take 2 tablets (40 mEq total) by mouth daily for 2 days.    Dispense:  4 tablet    Refill:  0     Order Specific Question:   Supervising Provider    Answer:   Christin Bach  V [[id number]]   NST reviewed- reactive  Treatments in MAU included 1,000mg  Tylenol PO for HA- patient reports decrease in pain to 2/10 with medication. KDur tablet PO for hypokalemia.    Pt discharged. Pt stable prior to discharge.    Assessment: 1. Hypokalemia   2. Short cervix during pregnancy in second trimester   3. [redacted] weeks gestation of pregnancy     Plan: Discharge home Preterm Labor precautions and fetal kick counts Follow up as scheduled in the office for prenatal appointments  Return to MAU as needed Rx for Potassium    Allergies as of 12/12/2017   No Known Allergies     Medication List    TAKE these medications   acetaminophen 500 MG tablet Commonly known as:  TYLENOL Take 500 mg by mouth every 6 (six) hours as needed for headache.   potassium chloride SA 20 MEQ tablet Commonly known as:  K-DUR,KLOR-CON Take 2 tablets (40 mEq total) by mouth daily for 2 days.   prenatal multivitamin Tabs tablet Take 1 tablet by mouth daily at 12 noon.   progesterone 200 MG capsule Commonly known as:  PROMETRIUM Place 1 capsule (200 mg total) vaginally daily.       Steward Drone Certified Nurse-Midwife 12/13/2017 7:09 AM

## 2017-12-12 NOTE — MAU Note (Signed)
Presents today after feeling not like herself.  Left eye felt funny and then felt like she was going to pass out

## 2017-12-14 LAB — CULTURE, OB URINE: Culture: NO GROWTH

## 2017-12-21 ENCOUNTER — Ambulatory Visit (INDEPENDENT_AMBULATORY_CARE_PROVIDER_SITE_OTHER): Payer: Medicaid Other | Admitting: Obstetrics and Gynecology

## 2017-12-21 VITALS — BP 115/68 | HR 83 | Wt 167.1 lb

## 2017-12-21 DIAGNOSIS — O26872 Cervical shortening, second trimester: Secondary | ICD-10-CM

## 2017-12-21 DIAGNOSIS — O0990 Supervision of high risk pregnancy, unspecified, unspecified trimester: Secondary | ICD-10-CM

## 2017-12-21 DIAGNOSIS — O0993 Supervision of high risk pregnancy, unspecified, third trimester: Secondary | ICD-10-CM

## 2017-12-21 DIAGNOSIS — O26873 Cervical shortening, third trimester: Secondary | ICD-10-CM

## 2017-12-21 NOTE — Progress Notes (Signed)
Prenatal Visit Note Date: 12/21/2017 Clinic: [facility]  Subjective:  Wendy Williamson is a 27 y.o. G2P0010 at 3132w2d being seen today for ongoing prenatal care.  She is currently monitored for the following issues for this high-risk pregnancy and has Short cervix during pregnancy in second trimester and Supervision of high risk pregnancy, antepartum on their problem list.  Patient reports no complaints.   Contractions: Not present. Vag. Bleeding: None.  Movement: Present. Denies leaking of fluid.   The following portions of the patient's history were reviewed and updated as appropriate: allergies, current medications, past family history, past medical history, past social history, past surgical history and problem list. Problem list updated.  Objective:   Vitals:   12/21/17 1350  BP: 115/68  Pulse: 83  Weight: 167 lb 1.6 oz (75.8 kg)    Fetal Status: Fetal Heart Rate (bpm): 130 Fundal Height: 34 cm Movement: Present  Presentation: Vertex  General:  Alert, oriented and cooperative. Patient is in no acute distress.  Skin: Skin is warm and dry. No rash noted.   Cardiovascular: Normal heart rate noted  Respiratory: Normal respiratory effort, no problems with respiration noted  Abdomen: Soft, gravid, appropriate for gestational age. Pain/Pressure: Present     Pelvic:  Cervical exam deferred        Extremities: Normal range of motion.  Edema: None  Mental Status: Normal mood and affect. Normal behavior. Normal judgment and thought content.   Urinalysis:      Assessment and Plan:  Pregnancy: G2P0010 at 7532w2d  1. Supervision of high risk pregnancy, antepartum GBS nv.   2. Short cervix during pregnancy in second trimester Continue with qhs vag progesterone until 37wks  Preterm labor symptoms and general obstetric precautions including but not limited to vaginal bleeding, contractions, leaking of fluid and fetal movement were reviewed in detail with the  patient. Please refer to After Visit Summary for other counseling recommendations.  Return in about 10 days (around 12/31/2017) for hrob.   [place] BingPickens, Islay Polanco, MD

## 2017-12-22 ENCOUNTER — Encounter: Payer: Self-pay | Admitting: Obstetrics and Gynecology

## 2018-01-04 ENCOUNTER — Other Ambulatory Visit (HOSPITAL_COMMUNITY)
Admission: RE | Admit: 2018-01-04 | Discharge: 2018-01-04 | Disposition: A | Discharge status: Home / Self Care | Payer: Medicaid Other | Source: Ambulatory Visit | Attending: Obstetrics and Gynecology | Admitting: Obstetrics and Gynecology

## 2018-01-04 ENCOUNTER — Encounter: Payer: Self-pay | Admitting: Obstetrics and Gynecology

## 2018-01-04 ENCOUNTER — Ambulatory Visit (INDEPENDENT_AMBULATORY_CARE_PROVIDER_SITE_OTHER): Payer: Medicaid Other | Admitting: Obstetrics and Gynecology

## 2018-01-04 VITALS — BP 121/72 | HR 95 | Wt 171.3 lb

## 2018-01-04 DIAGNOSIS — O26872 Cervical shortening, second trimester: Secondary | ICD-10-CM | POA: Insufficient documentation

## 2018-01-04 DIAGNOSIS — O0990 Supervision of high risk pregnancy, unspecified, unspecified trimester: Secondary | ICD-10-CM

## 2018-01-04 DIAGNOSIS — O0992 Supervision of high risk pregnancy, unspecified, second trimester: Secondary | ICD-10-CM | POA: Insufficient documentation

## 2018-01-04 DIAGNOSIS — O26873 Cervical shortening, third trimester: Secondary | ICD-10-CM

## 2018-01-04 DIAGNOSIS — O0993 Supervision of high risk pregnancy, unspecified, third trimester: Secondary | ICD-10-CM

## 2018-01-04 NOTE — Progress Notes (Signed)
   PRENATAL VISIT NOTE  Subjective:  Wendy Williamson is a 27 y.o. G2P0010 at 3860w2d being seen today for ongoing prenatal care.  She is currently monitored for the following issues for this high-risk pregnancy and has Short cervix during pregnancy in second trimester and Supervision of high risk pregnancy, antepartum on their problem list.  Patient reports no complaints. Occasionally feels pressure.  Contractions: Not present. Vag. Bleeding: None.  Movement: Present. Denies leaking of fluid.   The following portions of the patient's history were reviewed and updated as appropriate: allergies, current medications, past family history, past medical history, past social history, past surgical history and problem list. Problem list updated.  Objective:   Vitals:   01/04/18 1308  BP: 121/72  Pulse: 95  Weight: 171 lb 4.8 oz (77.7 kg)    Fetal Status: Fetal Heart Rate (bpm): 130   Movement: Present     General:  Alert, oriented and cooperative. Patient is in no acute distress.  Skin: Skin is warm and dry. No rash noted.   Cardiovascular: Normal heart rate noted  Respiratory: Normal respiratory effort, no problems with respiration noted  Abdomen: Soft, gravid, appropriate for gestational age.  Pain/Pressure: Absent   Cephalic by palpation  Pelvic: Cervical exam deferred        Extremities: Normal range of motion.  Edema: None  Mental Status: Normal mood and affect. Normal behavior. Normal judgment and thought content.   Assessment and Plan:  Pregnancy: G2P0010 at 3360w2d  1. Supervision of high risk pregnancy, antepartum - GC[facility]Chlamydia probe amp [facility] - Culture, beta strep (group b only)  2. Short cervix during pregnancy in second trimester To stop prometrium  Preterm labor symptoms and general obstetric precautions including but not limited to vaginal bleeding, contractions, leaking of fluid and fetal movement were reviewed in detail with the patient. Please refer  to After Visit Summary for other counseling recommendations.  Return in about 1 week (around 01/11/2018) for OB visit (MD).  Future Appointments  Date Time Provider Department Center  01/11/2018  3:55 PM [place] BingPickens, Charlie, [facility]  01/18/2018  3:55 PM [place] BingPickens, Charlie, [facility]  01/25/2018  3:55 PM Conan Bowensavis, Ralf Konopka M, MD [facility]  02/07/2018  4:15 PM Pincus LargePhelps, Jazma Y, DO [facility]    Conan BowensKelly M Bennie Chirico, MD

## 2018-01-05 LAB — GC/CHLAMYDIA PROBE AMP (~~LOC~~) NOT AT ARMC
CHLAMYDIA, DNA PROBE: NEGATIVE
NEISSERIA GONORRHEA: NEGATIVE

## 2018-01-07 LAB — CULTURE, BETA STREP (GROUP B ONLY): STREP GP B CULTURE: POSITIVE — AB

## 2018-01-11 ENCOUNTER — Encounter: Payer: Self-pay | Admitting: Obstetrics and Gynecology

## 2018-01-11 ENCOUNTER — Ambulatory Visit (INDEPENDENT_AMBULATORY_CARE_PROVIDER_SITE_OTHER): Payer: Medicaid Other | Admitting: Obstetrics and Gynecology

## 2018-01-11 VITALS — BP 118/68 | HR 101 | Wt 171.9 lb

## 2018-01-11 DIAGNOSIS — O0990 Supervision of high risk pregnancy, unspecified, unspecified trimester: Secondary | ICD-10-CM

## 2018-01-11 DIAGNOSIS — O99820 Streptococcus B carrier state complicating pregnancy: Secondary | ICD-10-CM | POA: Insufficient documentation

## 2018-01-11 DIAGNOSIS — O0993 Supervision of high risk pregnancy, unspecified, third trimester: Secondary | ICD-10-CM

## 2018-01-11 NOTE — Progress Notes (Signed)
118/68

## 2018-01-13 NOTE — Progress Notes (Signed)
Prenatal Visit Note Date: 01/11/2018 Clinic: [facility]  Subjective:  Wendy Williamson is a 27 y.o. G2P0010 at [redacted]w[redacted]d being seen today for ongoing prenatal care.  She is currently monitored for the following issues for this high-risk pregnancy and has Short cervix during pregnancy in second trimester; Supervision of high risk pregnancy, antepartum; and GBS (group B Streptococcus carrier), +RV culture, currently pregnant on their problem list.  Patient reports no complaints.   Contractions: Not present. Vag. Bleeding: None.  Movement: Present. Denies leaking of fluid.   The following portions of the patient's history were reviewed and updated as appropriate: allergies, current medications, past family history, past medical history, past social history, past surgical history and problem list. Problem list updated.  Objective:   Vitals:   01/11/18 1602  BP: 118/68  Pulse: (!) 101  Weight: 171 lb 14.4 oz (78 kg)    Fetal Status: Fetal Heart Rate (bpm): 130 Fundal Height: 37 cm Movement: Present  Presentation: Vertex  General:  Alert, oriented and cooperative. Patient is in no acute distress.  Skin: Skin is warm and dry. No rash noted.   Cardiovascular: Normal heart rate noted  Respiratory: Normal respiratory effort, no problems with respiration noted  Abdomen: Soft, gravid, appropriate for gestational age. Pain/Pressure: Present     Pelvic:  Cervical exam deferred Dilation: 2 Effacement (%): 50 Station: Ballotable  Extremities: Normal range of motion.  Edema: None  Mental Status: Normal mood and affect. Normal behavior. Normal judgment and thought content.   Urinalysis:      Assessment and Plan:  Pregnancy: G2P0010 at [redacted]w[redacted]d  Routine care. D/w pt re: BC nv.   Term labor symptoms and general obstetric precautions including but not limited to vaginal bleeding, contractions, leaking of fluid and fetal movement were reviewed in detail with the patient. Please refer  to After Visit Summary for other counseling recommendations.  Return in about 1 week (around 01/18/2018).   [place] Bing, MD

## 2018-01-18 ENCOUNTER — Ambulatory Visit (INDEPENDENT_AMBULATORY_CARE_PROVIDER_SITE_OTHER): Payer: Medicaid Other | Admitting: Obstetrics and Gynecology

## 2018-01-18 DIAGNOSIS — O0990 Supervision of high risk pregnancy, unspecified, unspecified trimester: Secondary | ICD-10-CM

## 2018-01-19 NOTE — Progress Notes (Signed)
Prenatal Visit Note Date: 01/18/2018 Clinic: [facility]  Subjective:  Wendy Williamson is a 27 y.o. G2P0010 at [redacted]w[redacted]d being seen today for ongoing prenatal care.  She is currently monitored for the following issues for this low-risk pregnancy and has Short cervix during pregnancy in second trimester; Supervision of high risk pregnancy, antepartum; and GBS (group B Streptococcus carrier), +RV culture, currently pregnant on their problem list.  Patient reports no complaints.   Contractions: Irritability. Vag. Bleeding: None.  Movement: Present. Denies leaking of fluid.   The following portions of the patient's history were reviewed and updated as appropriate: allergies, current medications, past family history, past medical history, past social history, past surgical history and problem list. Problem list updated.  Objective:   Vitals:   01/18/18 1558  BP: 120/81  Pulse: 94  Weight: 174 lb 3.2 oz (79 kg)    Fetal Status: Fetal Heart Rate (bpm): 146 Fundal Height: 37 cm Movement: Present  Presentation: Vertex  General:  Alert, oriented and cooperative. Patient is in no acute distress.  Skin: Skin is warm and dry. No rash noted.   Cardiovascular: Normal heart rate noted  Respiratory: Normal respiratory effort, no problems with respiration noted  Abdomen: Soft, gravid, appropriate for gestational age. Pain/Pressure: Present     Pelvic:  Cervical exam performed Dilation: 2 Effacement (%): 50 Station: Ballotable  Extremities: Normal range of motion.  Edema: None  Mental Status: Normal mood and affect. Normal behavior. Normal judgment and thought content.   Urinalysis:      Assessment and Plan:  Pregnancy: G2P0010 at [redacted]w[redacted]d  1. Supervision of high risk pregnancy, antepartum Routine care  Term labor symptoms and general obstetric precautions including but not limited to vaginal bleeding, contractions, leaking of fluid and fetal movement were reviewed in detail with  the patient. Please refer to After Visit Summary for other counseling recommendations.  Return in about 1 week (around 01/25/2018) for 7-10d rob.   [place] Bing, MD

## 2018-01-25 ENCOUNTER — Encounter: Payer: Self-pay | Admitting: Obstetrics and Gynecology

## 2018-01-25 ENCOUNTER — Ambulatory Visit (INDEPENDENT_AMBULATORY_CARE_PROVIDER_SITE_OTHER): Payer: Medicaid Other | Admitting: Obstetrics and Gynecology

## 2018-01-25 VITALS — BP 120/57 | HR 96 | Wt 172.1 lb

## 2018-01-25 DIAGNOSIS — O26872 Cervical shortening, second trimester: Secondary | ICD-10-CM

## 2018-01-25 DIAGNOSIS — O0990 Supervision of high risk pregnancy, unspecified, unspecified trimester: Secondary | ICD-10-CM

## 2018-01-25 DIAGNOSIS — O99820 Streptococcus B carrier state complicating pregnancy: Secondary | ICD-10-CM

## 2018-01-25 NOTE — Progress Notes (Signed)
Addendum: 5:45pm scheduled for IOL 02/05/18 0800

## 2018-01-25 NOTE — Progress Notes (Signed)
   PRENATAL VISIT NOTE  Subjective:  Wendy Williamson is a 27 y.o. G2P0010 at [redacted]w[redacted]d being seen today for ongoing prenatal care.  She is currently monitored for the following issues for this low-risk pregnancy and has Short cervix during pregnancy in second trimester; Supervision of high risk pregnancy, antepartum; and GBS (group B Streptococcus carrier), +RV culture, currently pregnant on their problem list.  Patient reports occasional contractions and pulling pain in groin occasionally.  Contractions: Not present. Vag. Bleeding: None.  Movement: Present. Denies leaking of fluid.   The following portions of the patient's history were reviewed and updated as appropriate: allergies, current medications, past family history, past medical history, past social history, past surgical history and problem list. Problem list updated.  Objective:   Vitals:   01/25/18 1631  BP: (!) 120/57  Pulse: 96  Weight: 172 lb 1.6 oz (78.1 kg)    Fetal Status: Fetal Heart Rate (bpm): 140   Movement: Present     General:  Alert, oriented and cooperative. Patient is in no acute distress.  Skin: Skin is warm and dry. No rash noted.   Cardiovascular: Normal heart rate noted  Respiratory: Normal respiratory effort, no problems with respiration noted  Abdomen: Soft, gravid, appropriate for gestational age.  Pain/Pressure: Present     Pelvic: Cervical exam deferred      3-4/50/-1  Extremities: Normal range of motion.  Edema: None  Mental Status: Normal mood and affect. Normal behavior. Normal judgment and thought content.   Assessment and Plan:  Pregnancy: G2P0010 at [redacted]w[redacted]d  1. Short cervix during pregnancy in second trimester  2. Supervision of high risk pregnancy, antepartum IOL scheduled for 41 weeks  3. GBS (group B Streptococcus carrier), +RV culture, currently pregnant ppx in labor  Term labor symptoms and general obstetric precautions including but not limited to vaginal bleeding, contractions,  leaking of fluid and fetal movement were reviewed in detail with the patient. Please refer to After Visit Summary for other counseling recommendations.  Return in about 1 week (around 02/01/2018) for OB visit.  Future Appointments  Date Time Provider Department Center  02/03/2018  1:55 PM Pincus Large, DO [facility]  02/05/2018  8:00 AM [facility] ROOM [facility] None  02/07/2018  4:15 PM Pincus Large, DO [facility]    Conan Bowens, MD

## 2018-01-26 ENCOUNTER — Encounter (HOSPITAL_COMMUNITY): Payer: Self-pay | Admitting: *Deleted

## 2018-01-26 ENCOUNTER — Telehealth (HOSPITAL_COMMUNITY): Payer: Self-pay | Admitting: *Deleted

## 2018-01-26 NOTE — Addendum Note (Signed)
Addended by: Leroy Libman on: 01/26/2018 08:34 AM   Modules accepted: Orders, SmartSet

## 2018-01-26 NOTE — Telephone Encounter (Signed)
Preadmission screen  

## 2018-01-30 ENCOUNTER — Encounter (HOSPITAL_COMMUNITY): Payer: Self-pay | Admitting: *Deleted

## 2018-01-30 ENCOUNTER — Inpatient Hospital Stay (HOSPITAL_COMMUNITY)
Admission: AD | Admit: 2018-01-30 | Discharge: 2018-01-30 | Disposition: A | Discharge status: Home / Self Care | Payer: Medicaid Other | Source: Ambulatory Visit | Attending: Obstetrics and Gynecology | Admitting: Obstetrics and Gynecology

## 2018-01-30 ENCOUNTER — Encounter: Payer: Self-pay | Admitting: Obstetrics and Gynecology

## 2018-01-30 DIAGNOSIS — O26872 Cervical shortening, second trimester: Secondary | ICD-10-CM

## 2018-01-30 DIAGNOSIS — O26893 Other specified pregnancy related conditions, third trimester: Secondary | ICD-10-CM

## 2018-01-30 DIAGNOSIS — Z3A40 40 weeks gestation of pregnancy: Secondary | ICD-10-CM

## 2018-01-30 DIAGNOSIS — N898 Other specified noninflammatory disorders of vagina: Secondary | ICD-10-CM

## 2018-01-30 DIAGNOSIS — Z3689 Encounter for other specified antenatal screening: Secondary | ICD-10-CM

## 2018-01-30 NOTE — MAU Provider Note (Signed)
History   [id number]   Chief Complaint  Patient presents with  . Rupture of Membranes    HPI Wendy Williamson is a 27 y.o. female  G2P0010 .0 wks here with report of leaking clear fluid around 0600.  Leaking of fluid has not continued. Pt reports rare contractions. She denies vaginal bleeding. Last intercourse was 2 days ago. She reports good fetal movement. All other systems negative.    Patient's last menstrual period was 04/29/2017.  OB History  Gravida Para Term Preterm AB Living  2 0 0 0 1 0  SAB TAB Ectopic Multiple Live Births  0 1 0 0 0    # Outcome Date GA Lbr Len/2nd Weight Sex Delivery Anes PTL Lv  2 Current           1 TAB 04/2016            Obstetric Comments  G1- first trimester TAB, ended up with D&C d/t retained POC    Past Medical History:  Diagnosis Date  . Medical history non-contributory     Family History  Problem Relation Age of Onset  . Mitral valve prolapse Mother   . Clotting disorder Mother   . Mitral valve prolapse Maternal Aunt     Social History   Socioeconomic History  . Marital status: Single    Spouse name: Not on file  . Number of children: Not on file  . Years of education: Not on file  . Highest education level: Not on file  Occupational History  . Not on file  Social Needs  . Financial resource strain: Not on file  . Food insecurity:    Worry: Not on file    Inability: Not on file  . Transportation needs:    Medical: Not on file    Non-medical: Not on file  Tobacco Use  . Smoking status: Former Games developer  . Smokeless tobacco: Never Used  Substance and Sexual Activity  . Alcohol use: No    Frequency: Never    Comment: before pregnancy  . Drug use: No  . Sexual activity: Not Currently    Birth control/protection: None  Lifestyle  . Physical activity:    Days per week: Not on file    Minutes per session: Not on file  . Stress: Not on file  Relationships  . Social connections:    Talks on phone: Not on file     Gets together: Not on file    Attends religious service: Not on file    Active member of club or organization: Not on file    Attends meetings of clubs or organizations: Not on file    Relationship status: Not on file  Other Topics Concern  . Not on file  Social History Narrative  . Not on file    No Known Allergies  No current facility-administered medications on file prior to encounter.    Current Outpatient Medications on File Prior to Encounter  Medication Sig Dispense Refill  . acetaminophen (TYLENOL) 500 MG tablet Take 500 mg by mouth every 6 (six) hours as needed for headache.    . potassium chloride SA (K-DUR,KLOR-CON) 20 MEQ tablet Take 2 tablets (40 mEq total) by mouth daily for 2 days. 4 tablet 0  . Prenatal Vit-Fe Fumarate-FA (PRENATAL MULTIVITAMIN) TABS tablet Take 1 tablet by mouth daily at 12 noon.    . progesterone (PROMETRIUM) 200 MG capsule Place 1 capsule (200 mg total) vaginally daily. (Patient not taking: Reported on 01/11/2018)  30 capsule 6     Review of Systems  Gastrointestinal: Negative for abdominal pain.  Genitourinary: Positive for vaginal discharge. Negative for vaginal bleeding.   Physical Exam   Vitals:   01/30/18 1148  BP: 137/71  Pulse: 89  Resp: 17  Temp: 98.7 F (37.1 C)  TempSrc: Oral  SpO2: 99%  Weight: 176 lb (79.8 kg)  Height:  (1.549 m)    Physical Exam  Constitutional: She is oriented to person, place, and time. She appears well-developed and well-nourished. No distress.  HENT:  Head: Normocephalic and atraumatic.  Neck: Normal range of motion.  Cardiovascular: Normal rate.  Respiratory: Effort normal.  GI: Soft. She exhibits no distension. There is no tenderness.  gravid  Genitourinary:  Genitourinary Comments: SSE: no pool, fern neg SVE: 4/80/-2, BBOW, vtx  Musculoskeletal: Normal range of motion.  Neurological: She is alert and oriented to person, place, and time.  Skin: Skin is warm and dry.  Psychiatric:  She has a normal mood and affect.   EFM: 130 bpm, mod variability, + accels, no decels Toco: rare  No results found for this or any previous visit (from the past 24 hour(s)).  MAU Course  Procedures  MDM No cervical change since office visit. No evidence of SROM or labor. Stable for discharge home.  Assessment and Plan   1. [redacted] weeks gestation of pregnancy   2. Short cervix during pregnancy in second trimester   3. NST (non-stress test) reactive   4. Vaginal discharge during pregnancy in third trimester    Discharge home Follow up in OB office in 4 days as scheduled Labor precautions  Allergies as of 01/30/2018   No Known Allergies     Medication List    STOP taking these medications   potassium chloride SA 20 MEQ tablet Commonly known as:  K-DUR,KLOR-CON   progesterone 200 MG capsule Commonly known as:  PROMETRIUM     TAKE these medications   acetaminophen 500 MG tablet Commonly known as:  TYLENOL Take 500 mg by mouth every 6 (six) hours as needed for headache.   prenatal multivitamin Tabs tablet Take 1 tablet by mouth daily at 12 noon.      Donette Larry, CNM 01/30/2018 12:45 PM

## 2018-01-30 NOTE — Discharge Instructions (Signed)
Braxton Hicks Contractions °Contractions of the uterus can occur throughout pregnancy, but they are not always a sign that you are in labor. You may have practice contractions called Braxton Hicks contractions. These false labor contractions are sometimes confused with true labor. °What are Braxton Hicks contractions? °Braxton Hicks contractions are tightening movements that occur in the muscles of the uterus before labor. Unlike true labor contractions, these contractions do not result in opening (dilation) and thinning of the cervix. Toward the end of pregnancy (32-34 weeks), Braxton Hicks contractions can happen more often and may become stronger. These contractions are sometimes difficult to tell apart from true labor because they can be very uncomfortable. You should not feel embarrassed if you go to the hospital with false labor. °Sometimes, the only way to tell if you are in true labor is for your health care provider to look for changes in the cervix. The health care provider will do a physical exam and may monitor your contractions. If you are not in true labor, the exam should show that your cervix is not dilating and your water has not broken. °If there are other health problems associated with your pregnancy, it is completely safe for you to be sent home with false labor. You may continue to have Braxton Hicks contractions until you go into true labor. °How to tell the difference between true labor and false labor °True labor °· Contractions last 30-70 seconds. °· Contractions become very regular. °· Discomfort is usually felt in the top of the uterus, and it spreads to the lower abdomen and low back. °· Contractions do not go away with walking. °· Contractions usually become more intense and increase in frequency. °· The cervix dilates and gets thinner. °False labor °· Contractions are usually shorter and not as strong as true labor contractions. °· Contractions are usually irregular. °· Contractions  are often felt in the front of the lower abdomen and in the groin. °· Contractions may go away when you walk around or change positions while lying down. °· Contractions get weaker and are shorter-lasting as time goes on. °· The cervix usually does not dilate or become thin. °Follow these instructions at home: °· Take over-the-counter and prescription medicines only as told by your health care provider. °· Keep up with your usual exercises and follow other instructions from your health care provider. °· Eat and drink lightly if you think you are going into labor. °· If Braxton Hicks contractions are making you uncomfortable: °? Change your position from lying down or resting to walking, or change from walking to resting. °? Sit and rest in a tub of warm water. °? Drink enough fluid to keep your urine pale yellow. Dehydration may cause these contractions. °? Do slow and deep breathing several times an hour. °· Keep all follow-up prenatal visits as told by your health care provider. This is important. °Contact a health care provider if: °· You have a fever. °· You have continuous pain in your abdomen. °Get help right away if: °· Your contractions become stronger, more regular, and closer together. °· You have fluid leaking or gushing from your vagina. °· You pass blood-tinged mucus (bloody show). °· You have bleeding from your vagina. °· You have low back pain that you never had before. °· You feel your baby’s head pushing down and causing pelvic pressure. °· Your baby is not moving inside you as much as it used to. °Summary °· Contractions that occur before labor are called Braxton   Hicks contractions, false labor, or practice contractions. °· Braxton Hicks contractions are usually shorter, weaker, farther apart, and less regular than true labor contractions. True labor contractions usually become progressively stronger and regular and they become more frequent. °· Manage discomfort from Braxton Hicks contractions by  changing position, resting in a warm bath, drinking plenty of water, or practicing deep breathing. °This information is not intended to replace advice given to you by your health care provider. Make sure you discuss any questions you have with your health care provider. °Document Released: 01/13/2017 Document Revised: 01/13/2017 Document Reviewed: 01/13/2017 °Elsevier Interactive Patient Education © 2018 [employer] ° °

## 2018-01-30 NOTE — MAU Note (Signed)
Pt reports she is 40 weeks today, has had some pressure, had a lot of discharge this am and she just wants to be checked to see if she is dilated.

## 2018-01-31 ENCOUNTER — Other Ambulatory Visit: Payer: Self-pay

## 2018-01-31 ENCOUNTER — Inpatient Hospital Stay (HOSPITAL_COMMUNITY): Payer: Medicaid Other | Admitting: Anesthesiology

## 2018-01-31 ENCOUNTER — Encounter (HOSPITAL_COMMUNITY)
Admission: AD | Disposition: A | Discharge status: Home / Self Care | Payer: Self-pay | Source: Ambulatory Visit | Attending: Obstetrics and Gynecology

## 2018-01-31 ENCOUNTER — Encounter (HOSPITAL_COMMUNITY): Payer: Self-pay

## 2018-01-31 ENCOUNTER — Inpatient Hospital Stay (HOSPITAL_COMMUNITY)
Admission: AD | Admit: 2018-01-31 | Discharge: 2018-02-02 | DRG: 787 | Disposition: A | Discharge status: Home / Self Care | Payer: Medicaid Other | Source: Ambulatory Visit | Attending: Obstetrics and Gynecology | Admitting: Obstetrics and Gynecology

## 2018-01-31 DIAGNOSIS — O4202 Full-term premature rupture of membranes, onset of labor within 24 hours of rupture: Secondary | ICD-10-CM

## 2018-01-31 DIAGNOSIS — O4292 Full-term premature rupture of membranes, unspecified as to length of time between rupture and onset of labor: Secondary | ICD-10-CM | POA: Diagnosis present

## 2018-01-31 DIAGNOSIS — O99824 Streptococcus B carrier state complicating childbirth: Secondary | ICD-10-CM | POA: Diagnosis not present

## 2018-01-31 DIAGNOSIS — O99820 Streptococcus B carrier state complicating pregnancy: Secondary | ICD-10-CM

## 2018-01-31 DIAGNOSIS — D649 Anemia, unspecified: Secondary | ICD-10-CM | POA: Diagnosis present

## 2018-01-31 DIAGNOSIS — O321XX0 Maternal care for breech presentation, not applicable or unspecified: Secondary | ICD-10-CM | POA: Diagnosis present

## 2018-01-31 DIAGNOSIS — O26872 Cervical shortening, second trimester: Secondary | ICD-10-CM

## 2018-01-31 DIAGNOSIS — Z3A40 40 weeks gestation of pregnancy: Secondary | ICD-10-CM

## 2018-01-31 DIAGNOSIS — O329XX0 Maternal care for malpresentation of fetus, unspecified, not applicable or unspecified: Secondary | ICD-10-CM | POA: Diagnosis present

## 2018-01-31 DIAGNOSIS — O26873 Cervical shortening, third trimester: Secondary | ICD-10-CM | POA: Diagnosis present

## 2018-01-31 DIAGNOSIS — Z87891 Personal history of nicotine dependence: Secondary | ICD-10-CM

## 2018-01-31 DIAGNOSIS — O9902 Anemia complicating childbirth: Secondary | ICD-10-CM | POA: Diagnosis present

## 2018-01-31 DIAGNOSIS — O0990 Supervision of high risk pregnancy, unspecified, unspecified trimester: Secondary | ICD-10-CM

## 2018-01-31 LAB — CBC
HCT: 34.2 % — ABNORMAL LOW (ref 36.0–46.0)
Hemoglobin: 11.5 g/dL — ABNORMAL LOW (ref 12.0–15.0)
MCH: 28.4 pg (ref 26.0–34.0)
MCHC: 33.6 g/dL (ref 30.0–36.0)
MCV: 84.4 fL (ref 78.0–100.0)
PLATELETS: 229 10*3/uL (ref 150–400)
RBC: 4.05 MIL/uL (ref 3.87–5.11)
RDW: 13.6 % (ref 11.5–15.5)
WBC: 14.8 10*3/uL — ABNORMAL HIGH (ref 4.0–10.5)

## 2018-01-31 LAB — TYPE AND SCREEN
ABO/RH(D): O POS
Antibody Screen: NEGATIVE

## 2018-01-31 LAB — POCT FERN TEST: POCT Fern Test: POSITIVE

## 2018-01-31 LAB — ABO/RH: ABO/RH(D): O POS

## 2018-01-31 LAB — CREATININE, SERUM
CREATININE: 0.69 mg/dL (ref 0.44–1.00)
GFR calc Af Amer: >60 mL/min (ref >60)

## 2018-01-31 SURGERY — Surgical Case
Anesthesia: Spinal | Site: Abdomen | Wound class: Clean Contaminated

## 2018-01-31 MED ORDER — DEXAMETHASONE SODIUM PHOSPHATE 4 MG/ML IJ SOLN
INTRAMUSCULAR | Status: AC
  Filled 2018-01-31: qty 1

## 2018-01-31 MED ORDER — WITCH HAZEL-GLYCERIN EX PADS: 1.0000 "application " | MEDICATED_PAD | CUTANEOUS | Status: DC | PRN

## 2018-01-31 MED ORDER — PROMETHAZINE HCL 25 MG/ML IJ SOLN: 6.2500 mg | INTRAMUSCULAR | Status: DC | PRN

## 2018-01-31 MED ORDER — PHENYLEPHRINE 8 MG IN D5W 100 ML (0.08MG/ML) PREMIX OPTIME
INJECTION | INTRAVENOUS | Status: DC | PRN
  Administered 2018-01-31: 60 ug/min via INTRAVENOUS

## 2018-01-31 MED ORDER — SOD CITRATE-CITRIC ACID 500-334 MG/5ML PO SOLN
ORAL | Status: AC
  Administered 2018-01-31: 30 mL via ORAL
  Filled 2018-01-31: qty 15

## 2018-01-31 MED ORDER — ONDANSETRON HCL 4 MG/2ML IJ SOLN
INTRAMUSCULAR | Status: DC | PRN
  Administered 2018-01-31: 4 mg via INTRAVENOUS

## 2018-01-31 MED ORDER — OXYTOCIN 40 UNITS IN LACTATED RINGERS INFUSION - SIMPLE MED: 2.5000 [IU]/h | INTRAVENOUS | Status: AC

## 2018-01-31 MED ORDER — SIMETHICONE 80 MG PO CHEW
80.0000 mg | CHEWABLE_TABLET | Freq: Three times a day (TID) | ORAL | Status: DC
  Administered 2018-02-01 – 2018-02-02 (×3): 80 mg via ORAL
  Filled 2018-01-31 (×4): qty 1

## 2018-01-31 MED ORDER — HYDROMORPHONE HCL 1 MG/ML IJ SOLN: 0.2500 mg | INTRAMUSCULAR | Status: DC | PRN

## 2018-01-31 MED ORDER — MORPHINE SULFATE (PF) 0.5 MG/ML IJ SOLN
INTRAMUSCULAR | Status: DC | PRN
  Administered 2018-01-31: 200 ug via INTRATHECAL
  Administered 2018-01-31: 20 ug via INTRATHECAL

## 2018-01-31 MED ORDER — KETOROLAC TROMETHAMINE 30 MG/ML IJ SOLN
30.0000 mg | Freq: Once | INTRAMUSCULAR | Status: AC | PRN
  Administered 2018-01-31: 30 mg via INTRAVENOUS

## 2018-01-31 MED ORDER — BUPIVACAINE IN DEXTROSE 0.75-8.25 % IT SOLN
INTRATHECAL | Status: AC
  Filled 2018-01-31: qty 2

## 2018-01-31 MED ORDER — MEPERIDINE HCL 25 MG/ML IJ SOLN
INTRAMUSCULAR | Status: AC
  Filled 2018-01-31: qty 1

## 2018-01-31 MED ORDER — POLYETHYLENE GLYCOL 3350 17 G PO PACK
17.0000 g | PACK | Freq: Every day | ORAL | Status: DC
  Administered 2018-02-01: 17 g via ORAL
  Filled 2018-01-31 (×3): qty 1

## 2018-01-31 MED ORDER — PHENYLEPHRINE HCL 10 MG/ML IJ SOLN
INTRAMUSCULAR | Status: DC | PRN
  Administered 2018-01-31: 80 ug via INTRAVENOUS

## 2018-01-31 MED ORDER — DIPHENHYDRAMINE HCL 25 MG PO CAPS: 25.0000 mg | ORAL_CAPSULE | Freq: Four times a day (QID) | ORAL | Status: DC | PRN

## 2018-01-31 MED ORDER — MENTHOL 3 MG MT LOZG: 1.0000 | LOZENGE | OROMUCOSAL | Status: DC | PRN

## 2018-01-31 MED ORDER — OXYTOCIN 10 UNIT/ML IJ SOLN
INTRAMUSCULAR | Status: AC
  Filled 2018-01-31: qty 4

## 2018-01-31 MED ORDER — COCONUT OIL OIL: 1.0000 "application " | TOPICAL_OIL | Status: DC | PRN

## 2018-01-31 MED ORDER — OXYTOCIN 10 UNIT/ML IJ SOLN
INTRAVENOUS | Status: DC | PRN
  Administered 2018-01-31: 40 [IU] via INTRAVENOUS

## 2018-01-31 MED ORDER — BUPIVACAINE IN DEXTROSE 0.75-8.25 % IT SOLN
INTRATHECAL | Status: DC | PRN
  Administered 2018-01-31: 1.6 mL via INTRATHECAL

## 2018-01-31 MED ORDER — OXYCODONE HCL 5 MG PO TABS: 10.0000 mg | ORAL_TABLET | Freq: Four times a day (QID) | ORAL | Status: DC | PRN

## 2018-01-31 MED ORDER — METOCLOPRAMIDE HCL 5 MG/ML IJ SOLN
INTRAMUSCULAR | Status: DC | PRN
  Administered 2018-01-31: 10 mg via INTRAVENOUS

## 2018-01-31 MED ORDER — LACTATED RINGERS IV SOLN
INTRAVENOUS | Status: DC
  Administered 2018-01-31: 125 mL/h via INTRAVENOUS

## 2018-01-31 MED ORDER — KETOROLAC TROMETHAMINE 30 MG/ML IJ SOLN
INTRAMUSCULAR | Status: AC
  Filled 2018-01-31: qty 1

## 2018-01-31 MED ORDER — PRENATAL MULTIVITAMIN CH
1.0000 | ORAL_TABLET | Freq: Every day | ORAL | Status: DC
  Administered 2018-02-01 – 2018-02-02 (×2): 1 via ORAL
  Filled 2018-01-31 (×2): qty 1

## 2018-01-31 MED ORDER — MORPHINE SULFATE (PF) 0.5 MG/ML IJ SOLN
INTRAMUSCULAR | Status: AC
  Filled 2018-01-31: qty 10

## 2018-01-31 MED ORDER — PHENYLEPHRINE 8 MG IN D5W 100 ML (0.08MG/ML) PREMIX OPTIME
INJECTION | INTRAVENOUS | Status: AC
  Filled 2018-01-31: qty 100

## 2018-01-31 MED ORDER — ACETAMINOPHEN 325 MG PO TABS
650.0000 mg | ORAL_TABLET | ORAL | Status: DC | PRN
  Administered 2018-02-01 – 2018-02-02 (×2): 650 mg via ORAL
  Filled 2018-01-31 (×2): qty 2

## 2018-01-31 MED ORDER — OXYCODONE HCL 5 MG PO TABS: 5.0000 mg | ORAL_TABLET | Freq: Four times a day (QID) | ORAL | Status: DC | PRN

## 2018-01-31 MED ORDER — MEPERIDINE HCL 25 MG/ML IJ SOLN
INTRAMUSCULAR | Status: DC | PRN
  Administered 2018-01-31 (×2): 12.5 mg via INTRAVENOUS

## 2018-01-31 MED ORDER — LACTATED RINGERS IV BOLUS
1000.0000 mL | Freq: Once | INTRAVENOUS | Status: AC
  Administered 2018-01-31 (×2): 1000 mL via INTRAVENOUS

## 2018-01-31 MED ORDER — SENNOSIDES-DOCUSATE SODIUM 8.6-50 MG PO TABS
2.0000 | ORAL_TABLET | Freq: Every evening | ORAL | Status: DC | PRN
  Administered 2018-02-01: 2 via ORAL
  Filled 2018-01-31: qty 2

## 2018-01-31 MED ORDER — FENTANYL CITRATE (PF) 100 MCG/2ML IJ SOLN
INTRAMUSCULAR | Status: DC | PRN
  Administered 2018-01-31: 10 ug via INTRATHECAL

## 2018-01-31 MED ORDER — SODIUM CHLORIDE 0.9 % IV SOLN
500.0000 mg | Freq: Once | INTRAVENOUS | Status: AC
  Administered 2018-01-31: 500 mg via INTRAVENOUS
  Filled 2018-01-31: qty 500

## 2018-01-31 MED ORDER — DEXAMETHASONE SODIUM PHOSPHATE 10 MG/ML IJ SOLN
INTRAMUSCULAR | Status: DC | PRN
  Administered 2018-01-31: 10 mg via INTRAVENOUS

## 2018-01-31 MED ORDER — DIPHENHYDRAMINE HCL 50 MG/ML IJ SOLN
INTRAMUSCULAR | Status: AC
  Filled 2018-01-31: qty 1

## 2018-01-31 MED ORDER — TERBUTALINE SULFATE 1 MG/ML IJ SOLN
INTRAMUSCULAR | Status: AC
  Administered 2018-01-31: 0.25 mg via SUBCUTANEOUS
  Filled 2018-01-31: qty 1

## 2018-01-31 MED ORDER — LACTATED RINGERS IV SOLN
INTRAVENOUS | Status: DC | PRN
  Administered 2018-01-31: 10:00:00 via INTRAVENOUS

## 2018-01-31 MED ORDER — METOCLOPRAMIDE HCL 5 MG/ML IJ SOLN
INTRAMUSCULAR | Status: AC
  Filled 2018-01-31: qty 2

## 2018-01-31 MED ORDER — FENTANYL CITRATE (PF) 100 MCG/2ML IJ SOLN
INTRAMUSCULAR | Status: AC
  Filled 2018-01-31: qty 2

## 2018-01-31 MED ORDER — DIBUCAINE 1 % RE OINT: 1.0000 "application " | TOPICAL_OINTMENT | RECTAL | Status: DC | PRN

## 2018-01-31 MED ORDER — ENOXAPARIN SODIUM 40 MG/0.4ML ~~LOC~~ SOLN
40.0000 mg | SUBCUTANEOUS | Status: DC
  Administered 2018-02-01 – 2018-02-02 (×2): 40 mg via SUBCUTANEOUS
  Filled 2018-01-31 (×2): qty 0.4

## 2018-01-31 MED ORDER — TETANUS-DIPHTH-ACELL PERTUSSIS 5-2.5-18.5 LF-MCG/0.5 IM SUSP: 0.5000 mL | Freq: Once | INTRAMUSCULAR | Status: DC

## 2018-01-31 MED ORDER — MEPERIDINE HCL 25 MG/ML IJ SOLN: 6.2500 mg | INTRAMUSCULAR | Status: DC | PRN

## 2018-01-31 MED ORDER — CEFAZOLIN SODIUM-DEXTROSE 2-4 GM/100ML-% IV SOLN
2.0000 g | INTRAVENOUS | Status: AC
  Administered 2018-01-31: 2 g via INTRAVENOUS
  Filled 2018-01-31: qty 100

## 2018-01-31 MED ORDER — LACTATED RINGERS IV SOLN
INTRAVENOUS | Status: DC | PRN
  Administered 2018-01-31 (×2): via INTRAVENOUS

## 2018-01-31 MED ORDER — OXYCODONE HCL 5 MG/5ML PO SOLN: 5.0000 mg | Freq: Once | ORAL | Status: DC | PRN

## 2018-01-31 MED ORDER — TERBUTALINE SULFATE 1 MG/ML IJ SOLN
0.2500 mg | Freq: Once | INTRAMUSCULAR | Status: AC
  Administered 2018-01-31: 0.25 mg via SUBCUTANEOUS

## 2018-01-31 MED ORDER — SOD CITRATE-CITRIC ACID 500-334 MG/5ML PO SOLN
30.0000 mL | ORAL | Status: AC
  Administered 2018-01-31: 30 mL via ORAL

## 2018-01-31 MED ORDER — PHENYLEPHRINE 40 MCG/ML (10ML) SYRINGE FOR IV PUSH (FOR BLOOD PRESSURE SUPPORT)
PREFILLED_SYRINGE | INTRAVENOUS | Status: AC
  Filled 2018-01-31: qty 10

## 2018-01-31 MED ORDER — SCOPOLAMINE 1 MG/3DAYS TD PT72
MEDICATED_PATCH | TRANSDERMAL | Status: DC | PRN
  Administered 2018-01-31: 1 via TRANSDERMAL

## 2018-01-31 MED ORDER — IBUPROFEN 600 MG PO TABS
600.0000 mg | ORAL_TABLET | Freq: Four times a day (QID) | ORAL | Status: DC | PRN
  Administered 2018-02-01 – 2018-02-02 (×5): 600 mg via ORAL
  Filled 2018-01-31 (×5): qty 1

## 2018-01-31 MED ORDER — OXYCODONE HCL 5 MG PO TABS: 5.0000 mg | ORAL_TABLET | Freq: Once | ORAL | Status: DC | PRN

## 2018-01-31 MED ORDER — DIPHENHYDRAMINE HCL 50 MG/ML IJ SOLN
INTRAMUSCULAR | Status: DC | PRN
  Administered 2018-01-31: 12.5 mg via INTRAVENOUS

## 2018-01-31 MED ORDER — ONDANSETRON HCL 4 MG/2ML IJ SOLN
INTRAMUSCULAR | Status: AC
  Filled 2018-01-31: qty 2

## 2018-01-31 SURGICAL SUPPLY — 36 items
BENZOIN TINCTURE PRP APPL 2/3 (GAUZE/BANDAGES/DRESSINGS) ×2 IMPLANT
CANISTER SUCT 3000ML PPV (MISCELLANEOUS) ×2 IMPLANT
CHLORAPREP W/TINT 26ML (MISCELLANEOUS) ×2 IMPLANT
DERMABOND ADVANCED (GAUZE/BANDAGES/DRESSINGS) ×1
DERMABOND ADVANCED .7 DNX12 (GAUZE/BANDAGES/DRESSINGS) ×1 IMPLANT
DRSG OPSITE POSTOP 4X10 (GAUZE/BANDAGES/DRESSINGS) ×2 IMPLANT
ELECT REM PT RETURN 9FT ADLT (ELECTROSURGICAL) ×2
ELECTRODE REM PT RTRN 9FT ADLT (ELECTROSURGICAL) ×1 IMPLANT
EXTRACTOR VACUUM KIWI (MISCELLANEOUS) ×2 IMPLANT
GLOVE BIOGEL PI IND STRL 7.0 (GLOVE) ×2 IMPLANT
GLOVE BIOGEL PI IND STRL 7.5 (GLOVE) ×1 IMPLANT
GLOVE BIOGEL PI INDICATOR 7.0 (GLOVE) ×2
GLOVE BIOGEL PI INDICATOR 7.5 (GLOVE) ×1
GLOVE SKINSENSE NS SZ7.0 (GLOVE) ×1
GLOVE SKINSENSE STRL SZ7.0 (GLOVE) ×1 IMPLANT
GOWN STRL REUS W/ TWL LRG LVL3 (GOWN DISPOSABLE) ×2 IMPLANT
GOWN STRL REUS W/ TWL XL LVL3 (GOWN DISPOSABLE) ×1 IMPLANT
GOWN STRL REUS W/TWL LRG LVL3 (GOWN DISPOSABLE) ×2
GOWN STRL REUS W/TWL XL LVL3 (GOWN DISPOSABLE) ×1
NS IRRIG 1000ML POUR BTL (IV SOLUTION) ×2 IMPLANT
PACK C SECTION WH (CUSTOM PROCEDURE TRAY) ×2 IMPLANT
PAD ABD 7.5X8 STRL (GAUZE/BANDAGES/DRESSINGS) ×2 IMPLANT
PAD OB MATERNITY 4.3X12.25 (PERSONAL CARE ITEMS) ×2 IMPLANT
PAD PREP 24X48 CUFFED NSTRL (MISCELLANEOUS) ×2 IMPLANT
PENCIL SMOKE EVAC W/HOLSTER (ELECTROSURGICAL) ×2 IMPLANT
RETRACTOR WND ALEXIS 25 LRG (MISCELLANEOUS) ×1 IMPLANT
RTRCTR WOUND ALEXIS 25CM LRG (MISCELLANEOUS) ×2
STRIP CLOSURE SKIN 1/2X4 (GAUZE/BANDAGES/DRESSINGS) ×2 IMPLANT
SUT MNCRL 0 VIOLET CTX 36 (SUTURE) ×2 IMPLANT
SUT MON AB 4-0 PS1 27 (SUTURE) ×2 IMPLANT
SUT MONOCRYL 0 CTX 36 (SUTURE) ×2
SUT PLAIN 2 0 XLH (SUTURE) ×2 IMPLANT
SUT VIC AB 0 CT1 36 (SUTURE) ×4 IMPLANT
SUT VIC AB 3-0 CT1 27 (SUTURE) ×1
SUT VIC AB 3-0 CT1 TAPERPNT 27 (SUTURE) ×1 IMPLANT
TOWEL OR 17X24 6PK STRL BLUE (TOWEL DISPOSABLE) ×4 IMPLANT

## 2018-01-31 NOTE — Anesthesia Postprocedure Evaluation (Signed)
Anesthesia Post Note  Patient: Wendy Williamson  Procedure(s) Performed: CESAREAN SECTION (N/A Abdomen)     Patient location during evaluation: Mother Baby Anesthesia Type: Spinal Level of consciousness: awake and alert and oriented Pain management: satisfactory to patient Vital Signs Assessment: post-procedure vital signs reviewed and stable Respiratory status: respiratory function stable and spontaneous breathing Cardiovascular status: blood pressure returned to baseline Postop Assessment: no headache, no backache, spinal receding, patient able to bend at knees and adequate PO intake Anesthetic complications: no    Last Vitals:  Vitals:   01/31/18 1315 01/31/18 1430  BP: 106/67 110/68  Pulse: 67 67  Resp: 20 18  Temp: 36.5 C (!) 36.4 C  SpO2: 98% 95%    Last Pain:  Vitals:   01/31/18 1430  TempSrc: Oral  PainSc: 3    Pain Goal:                 [name]

## 2018-01-31 NOTE — Transfer of Care (Signed)
Immediate Anesthesia Transfer of Care Note  Patient: Wendy Williamson  Procedure(s) Performed: CESAREAN SECTION (N/A Abdomen)  Patient Location: PACU  Anesthesia Type:Spinal  Level of Consciousness: awake, alert  and oriented  Airway & Oxygen Therapy: Patient Spontanous Breathing  Post-op Assessment: Report given to RN and Post -op Vital signs reviewed and stable  Post vital signs: Reviewed and stable  Last Vitals:  Vitals Value Taken Time  BP    Temp    Pulse 85 01/31/2018 10:58 AM  Resp 18 01/31/2018 10:58 AM  SpO2 100 % 01/31/2018 10:58 AM  Vitals shown include unvalidated device data.  Last Pain:  Vitals:   01/31/18 0808  TempSrc: Oral  PainSc:          Complications: No apparent anesthesia complications

## 2018-01-31 NOTE — Progress Notes (Signed)
OB Note I called the lab b/c labs not in process. They called me back a few minutes later and they stated they found the tubes and will runn now starting with the cbc. I d/w them need for moving with c-section.   Cornelia Copa MD Attending Center for [employer] (Faculty Practice) 01/31/2018 Time: [phone]

## 2018-01-31 NOTE — MAU Note (Signed)
Pt reporting possible ROM at 0630 of clear, odorless fluids. Contractions since ROM, Q61min. Denied FM or VB.

## 2018-01-31 NOTE — Addendum Note (Signed)
Addendum  created 01/31/18 1450 by Graciela Husbands, CRNA   Sign clinical note

## 2018-01-31 NOTE — Anesthesia Procedure Notes (Signed)
Spinal  Patient location during procedure: OB Start time: 01/31/2018 9:24 AM End time: 01/31/2018 9:29 AM Staffing Anesthesiologist: Lowella Curb, MD Performed: anesthesiologist  Preanesthetic Checklist Completed: patient identified, surgical consent, pre-op evaluation, timeout performed, IV checked, risks and benefits discussed and monitors and equipment checked Spinal Block Patient position: sitting Prep: site prepped and draped and DuraPrep Patient monitoring: heart rate, cardiac monitor, continuous pulse ox and blood pressure Approach: midline Location: L3-4 Injection technique: single-shot Needle Needle type: Pencan  Needle gauge: 24 G Needle length: 10 cm Assessment Sensory level: T4

## 2018-01-31 NOTE — Op Note (Signed)
Operative Note   SURGERY DATE: 01/31/2018  PRE-OP DIAGNOSIS:  *Pregnancy at 40/1 Homero Fellers breech *PPROM *Actie labor at 8cm *GBS positive (inadequately treated)  POST-OP DIAGNOSIS: Same. delivered   PROCEDURE: Urgent primary low transverse cesarean section via pfannenstiel skin incision with double layer uterine closure  SURGEON: Surgeon(s) and Role:    [place] Bing, MD - Primary  ASSISTANT: None  ANESTHESIA: spinal  ESTIMATED BLOOD LOSS:  DRAINS: UOP via indwelling foley  TOTAL IV FLUIDS: crystalloid  VTE PROPHYLAXIS: SCDs to bilateral lower extremities  ANTIBIOTICS: Two grams of Cefazolin were given., within 1 hour of skin incision and azithromycin  IV x 1 given in the OR  SPECIMENS: none  COMPLICATIONS: none  INDICATIONS: patient presented to [facility] triage with chief complaint of contractions s/p SROM a few hours earlier. Noted to be 8cm and breech on u/s.   FINDINGS: No intra-abdominal adhesions were noted. Grossly normal uterus, tubes and ovaries. Clear amniotic fluid, frank breech female infant, weight 4520gm, APGARs 7/9, intact placenta.  PROCEDURE IN DETAIL: The patient was taken to the operating room where anesthesia was administered and normal fetal heart tones were confirmed. She was then prepped and draped in the normal fashion in the dorsal supine position with a leftward tilt.  After a time out was performed, a pfannensteil  skin incision was made with the scalpel and carried through to the underlying layer of fascia. The fascia was then incised at the midline and this incision was extended laterally with the mayo scissors. Attention was turned to the superior aspect of the fascial incision which was grasped with the kocher clamps x 2, tented up and the rectus muscles were dissected off with the bovie. In a similar fashion the inferior aspect of the fascial incision was grasped with the kocher clamps, tented up and the rectus muscles  dissected off with the mayo scissors. The rectus muscles were then separated in the midline and the peritoneum was entered bluntly and the Alexis retractor placed. The vesicouterine peritoneum was identified, tented up and entered with the metzenbaum scissors. This incision was extended laterally and the bladder flap was created digitally  A low transverse hysterotomy was made with the scalpel until the endometrial cavity was breached and the amniotic sac ruptured, yielding clear amniotic fluid. This incision was extended bluntly and the infant's head, shoulders and body were delivered atraumatically.The cord was clamped x 2 and cut, and the infant was handed to the awaiting pediatricians, after delayed cord clamping was done.  The placenta was then gradually expressed from the uterus and then the uterus was exteriorized and cleared of all clots and debris. The hysterotomy was repaired with a running suture of 1-0 monocryl. A second imbricating layer of 1-0 monocryl suture was then placed; excellent hemostasis was noted.   The uterus and adnexa were then returned to the abdomen, and the hysterotomy and all operative sites were reinspected and excellent hemostasis was noted after irrigation and suction of the abdomen with warm saline.  The peritoneum was closed with a running stitch of 3-0 Vicryl. The fascia was reapproximated with 0 Vicryl in a simple running fashion bilaterally. The subcutaneous layer was then reapproximated with interrupted sutures of 2-0 plain gut, and the skin was then closed with 4-0 monocryl, in a subcuticular fashion.  The patient  tolerated the procedure well. Sponge, lap, needle, and instrument counts were correct x 2. The patient was transferred to the recovery room awake, alert and breathing independently in  stable condition.  Durene Romans MD Attending Center for [facility])

## 2018-01-31 NOTE — Anesthesia Postprocedure Evaluation (Signed)
Anesthesia Post Note  Patient: Wendy Williamson  Procedure(s) Performed: CESAREAN SECTION (N/A Abdomen)     Patient location during evaluation: PACU Anesthesia Type: Spinal Level of consciousness: oriented and awake and alert Pain management: pain level controlled Vital Signs Assessment: post-procedure vital signs reviewed and stable Respiratory status: spontaneous breathing and respiratory function stable Cardiovascular status: blood pressure returned to baseline and stable Postop Assessment: no headache, no backache and no apparent nausea or vomiting Anesthetic complications: no    Last Vitals:  Vitals:   01/31/18 1145 01/31/18 1200  BP: 109/90 114/67  Pulse: 88 82  Resp: 20 (!) 23  Temp:    SpO2: 98% 100%    Last Pain:  Vitals:   01/31/18 1200  TempSrc:   PainSc: 2    Pain Goal:                 Wendy Williamson

## 2018-01-31 NOTE — H&P (Addendum)
Obstetrics Admission History & Physical  01/31/2018 - 8:38 AM Primary OBGYN: [facility]  Chief Complaint: SROM at 0600 today and ?breech  History of Present Illness  27 y.o. G2P0010 @ [redacted]w[redacted]d, with the above CC. Pregnancy complicated by: GBS pos.  Ms. Shamecca Whitebread states that she SROM around 0600. Had some coffee with cream earlier today  Review of Systems: as noted in the History of Present Illness.  PMHx:  Past Medical History:  Diagnosis Date  . Medical history non-contributory    PSHx:  Past Surgical History:  Procedure Laterality Date  . DILATION AND CURETTAGE OF UTERUS  04/2016   RPOC from TAB  . TONSILLECTOMY     Medications:  Medications Prior to Admission  Medication Sig Dispense Refill Last Dose  . acetaminophen (TYLENOL) 500 MG tablet Take 500 mg by mouth every 6 (six) hours as needed for headache.   01/31/2018 at Unknown time  . Prenatal Vit-Fe Fumarate-FA (PRENATAL MULTIVITAMIN) TABS tablet Take 1 tablet by mouth daily at 12 noon.   01/30/2018 at Unknown time     Allergies: has No Known Allergies. OBHx:  OB History  Gravida Para Term Preterm AB Living  2 0 0 0 1 0  SAB TAB Ectopic Multiple Live Births  0 1 0 0 0    # Outcome Date GA Lbr Len/2nd Weight Sex Delivery Anes PTL Lv  2 Current           1 TAB 04/2016            Obstetric Comments  G1- first trimester TAB, ended up with D&C d/t retained POC            FHx:  Family History  Problem Relation Age of Onset  . Mitral valve prolapse Mother   . Clotting disorder Mother   . Mitral valve prolapse Maternal Aunt    Soc Hx:  Social History   Socioeconomic History  . Marital status: Single    Spouse name: Not on file  . Number of children: Not on file  . Years of education: Not on file  . Highest education level: Not on file  Occupational History  . Not on file  Social Needs  . Financial resource strain: Not on file  . Food insecurity:    Worry: Not on file    Inability: Not on file  .  Transportation needs:    Medical: Not on file    Non-medical: Not on file  Tobacco Use  . Smoking status: Former Games developer  . Smokeless tobacco: Never Used  Substance and Sexual Activity  . Alcohol use: No    Frequency: Never    Comment: before pregnancy  . Drug use: No  . Sexual activity: Not Currently    Birth control/protection: None  Lifestyle  . Physical activity:    Days per week: Not on file    Minutes per session: Not on file  . Stress: Not on file  Relationships  . Social connections:    Talks on phone: Not on file    Gets together: Not on file    Attends religious service: Not on file    Active member of club or organization: Not on file    Attends meetings of clubs or organizations: Not on file    Relationship status: Not on file  . Intimate partner violence:    Fear of current or ex partner: Not on file    Emotionally abused: Not on file    Physically abused:  Not on file    Forced sexual activity: Not on file  Other Topics Concern  . Not on file  Social History Narrative  . Not on file    Objective    Current Vital Signs 24h Vital Sign Ranges  T 97.8 F (36.6 C) Temp  Avg: 98.3 F (36.8 C)  Min: 97.8 F (36.6 C)  Max: 98.7 F (37.1 C)  BP 121/65 BP  Min: 115/69  Max: 137/71  HR 86 Pulse  Avg: 86  Min: 83  Max: 89  RR 20 Resp  Avg: 18  Min: 17  Max: 20  SaO2     SpO2  Avg: 99 %  Min: 99 %  Max: 99 %       24 Hour I/O Current Shift I/O  Time Ins Outs No intake/output data recorded. No intake/output data recorded.   EFM: 140 baseline, +accels, ?one variable, mod variability   Toco: q1-105m  General: Well nourished, well developed female in mod distress with UCs Skin:  Warm and dry.  Respiratory:  Normal respiratory effort Abdomen: gravid, nttp Neuro/Psych:  Normal mood and affect.   SVE: 8/75/0  Labs  None  Radiology Homero Fellers Breech, head in maternal RUQ  Perinatal info   ABO, Rh: O pos Antibody: Negative (10/22 0000) Rubella: Immune (10/22  0000) RPR: Non Reactive (03/01 0909)  HBsAg: Negative (10/22 0000)  HIV: Non Reactive (03/01 0909)   Assessment & Plan   26 y.o. G2P0010 @ [redacted]w[redacted]d with breech, ROM Consented for primary c-section. terb x 1. Will do ancef and give azithro in OR. D/w Dr. Hyacinth Meeker of anesthesia.   Cornelia Copa MD Attending Center for [facility])

## 2018-01-31 NOTE — Anesthesia Preprocedure Evaluation (Signed)
Anesthesia Evaluation  Patient identified by MRN, date of birth, ID band Patient awake    Reviewed: Allergy & Precautions, NPO status , Patient's Chart, lab work & pertinent test results  Airway Mallampati: II  TM Distance: >3 FB Neck ROM: Full    Dental no notable dental hx.    Pulmonary neg pulmonary ROS, former smoker,    Pulmonary exam normal breath sounds clear to auscultation       Cardiovascular negative cardio ROS Normal cardiovascular exam Rhythm:Regular Rate:Normal     Neuro/Psych negative neurological ROS  negative psych ROS   GI/Hepatic negative GI ROS, Neg liver ROS,   Endo/Other  negative endocrine ROS  Renal/GU negative Renal ROS  negative genitourinary   Musculoskeletal negative musculoskeletal ROS (+)   Abdominal   Peds negative pediatric ROS (+)  Hematology negative hematology ROS (+)   Anesthesia Other Findings   Reproductive/Obstetrics negative OB ROS (+) Pregnancy                             Anesthesia Physical Anesthesia Plan  ASA: II and emergent  Anesthesia Plan: Spinal   Post-op Pain Management:    Induction:   PONV Risk Score and Plan: 2 and Treatment may vary due to age or medical condition  Airway Management Planned: Natural Airway  Additional Equipment:   Intra-op Plan:   Post-operative Plan:   Informed Consent: I have reviewed the patients History and Physical, chart, labs and discussed the procedure including the risks, benefits and alternatives for the proposed anesthesia with the patient or authorized representative who has indicated his/her understanding and acceptance.   Dental advisory given  Plan Discussed with: CRNA  Anesthesia Plan Comments:         Anesthesia Quick Evaluation

## 2018-02-01 LAB — CBC
HEMATOCRIT: 25.8 % — AB (ref 36.0–46.0)
HEMOGLOBIN: 8.8 g/dL — AB (ref 12.0–15.0)
MCH: 28.5 pg (ref 26.0–34.0)
MCHC: 34.1 g/dL (ref 30.0–36.0)
MCV: 83.5 fL (ref 78.0–100.0)
Platelets: 215 10*3/uL (ref 150–400)
RBC: 3.09 MIL/uL — ABNORMAL LOW (ref 3.87–5.11)
RDW: 13.7 % (ref 11.5–15.5)
WBC: 16.5 10*3/uL — AB (ref 4.0–10.5)

## 2018-02-01 LAB — BIRTH TISSUE RECOVERY COLLECTION (PLACENTA DONATION)

## 2018-02-01 LAB — RPR: RPR Ser Ql: NONREACTIVE

## 2018-02-01 MED ORDER — SCOPOLAMINE 1 MG/3DAYS TD PT72
1.0000 | MEDICATED_PATCH | Freq: Once | TRANSDERMAL | Status: DC
  Filled 2018-02-01: qty 1

## 2018-02-01 MED ORDER — FERROUS FUMARATE 324 (106 FE) MG PO TABS
1.0000 | ORAL_TABLET | Freq: Two times a day (BID) | ORAL | Status: DC
  Administered 2018-02-01 – 2018-02-02 (×2): 106 mg via ORAL
  Filled 2018-02-01 (×4): qty 1

## 2018-02-01 MED ORDER — NALOXONE HCL 0.4 MG/ML IJ SOLN: 0.4000 mg | INTRAMUSCULAR | Status: DC | PRN

## 2018-02-01 MED ORDER — NALBUPHINE HCL 10 MG/ML IJ SOLN: 5.0000 mg | Freq: Once | INTRAMUSCULAR | Status: DC | PRN

## 2018-02-01 MED ORDER — NALBUPHINE HCL 10 MG/ML IJ SOLN: 5.0000 mg | INTRAMUSCULAR | Status: DC | PRN

## 2018-02-01 MED ORDER — ONDANSETRON HCL 4 MG/2ML IJ SOLN: 4.0000 mg | Freq: Three times a day (TID) | INTRAMUSCULAR | Status: DC | PRN

## 2018-02-01 MED ORDER — DIPHENHYDRAMINE HCL 25 MG PO CAPS: 25.0000 mg | ORAL_CAPSULE | ORAL | Status: DC | PRN

## 2018-02-01 MED ORDER — SODIUM CHLORIDE 0.9% FLUSH: 3.0000 mL | INTRAVENOUS | Status: DC | PRN

## 2018-02-01 MED ORDER — NALOXONE HCL 4 MG/10ML IJ SOLN: 1.0000 ug/kg/h | INTRAVENOUS | Status: DC | PRN

## 2018-02-01 MED ORDER — DIPHENHYDRAMINE HCL 50 MG/ML IJ SOLN: 12.5000 mg | INTRAMUSCULAR | Status: DC | PRN

## 2018-02-01 NOTE — Progress Notes (Signed)
Subjective: Postpartum Day 1: Cesarean Delivery Eating, drinking well. Foley out a couple of hours ago, hasn't voided yet. Has stood by bedside w/o difficulty. No flatus.  Lochia and pain wnl.  Denies dizziness, lightheadedness, or sob. No complaints.    Objective: Vital signs in last 24 hours: Temp:  [97.5 F (36.4 C)-99.2 F (37.3 C)] 98.7 F (37.1 C) (05/22 0516) Pulse Rate:  [64-88] 65 (05/22 0516) Resp:  [14-23] 15 (05/22 0516) BP: (96-121)/(58-90) 100/64 (05/22 0516) SpO2:  [95 %-100 %] 99 % (05/22 0516)  Physical Exam:  General: alert, cooperative and no distress Lochia: appropriate Uterine Fundus: firm Incision: small marked drainage on Rt side of honeycomb DVT Evaluation: No evidence of DVT seen on physical exam. Negative Homan's sign. No cords or calf tenderness. No significant calf/ankle edema.  Recent Labs    01/31/18 0830 02/01/18 0654  HGB 11.5* 8.8*  HCT 34.2* 25.8*    Assessment/Plan: Status post Cesarean section. Doing well postoperatively.  Continue current care.  Cheral Marker 02/01/2018, 10:01 AM

## 2018-02-01 NOTE — Progress Notes (Signed)
CSW received consult due to score 12 on Edinburgh Depression Screen and hx of depression.   When CSW arrived MOB was in bed bonding with infant as evidence by engaging in breastfeeding.  FOB was also present and MOB gave CSW permission for CSW to meet with MOB while FOB was present. CSW explained CSW's role and encouraged the family to ask questions.  MOB and FOB appeared to be very supportive of one another.  MOB was polite, easy to engage, and receptive to meeting with CSW. FOB also numerous questions and appeared interested in learning about PPD.   CSW asked about MOB's MH hx.  MOB acknowledged a hx of depression and reported being an established patient with Cynthia Cabanas at [facility]. MOB reported that MOB is not currently prescribed any medications and MOB's symptoms have been managed by weekly sessions with therapist. MOB plans to schedule an appointment with therapist within the next 2-4 weeks.   CSW provided education regarding the baby blues period vs. perinatal mood disorders, discussed treatment and gave resources for mental health follow up if concerns arise.  CSW recommends self-evaluation during the postpartum time period using the New Mom Checklist from Postpartum Progress and encouraged MOB to contact a medical professional if symptoms are noted at any time.  CSW assessed for safety and MOB denied SI and HI. MOB did not present with any acute symptoms and appeared to have insight and awareness.  MOB reported having a good support team and feeling prepared to parent.   CSW identifies no further need for intervention and no barriers to discharge at this time.  Deadrick Stidd Boyd-Gilyard, MSW, LCSW Clinical Social Work [phone]   

## 2018-02-01 NOTE — Lactation Note (Signed)
This note was copied from a baby's chart. Lactation Consultation Note Baby 20 hrs old. Mom states baby BF ok. Has been spitting some. Noted baby gagging. Abd. Distended Mom has tubular breast w/small flat compressible nipples. Easily expressed colostrum.  Nipple everts to very short shaft w/stimulation.  Assisted in football hold. Latched baby. Baby gulping at breast. Noted significant softening of the breast baby was nursing on.  Newborn feeding behavior, STS, I&O, cluster feeding discussed. Mom encouraged to feed baby 8-12 times/24 hours and with feeding cues.  Encouraged to call for assistance or questions.  [facility]/LC brochure given w/resources, support groups and LC services.  Patient Name: Wendy Williamson [id number] Date: 02/01/2018 Reason for consult: Initial assessment   Maternal Data Has patient been taught Hand Expression?: Yes Does the patient have breastfeeding experience prior to this delivery?: No  Feeding Feeding Type: Breast Fed Length of feed: 7 min  LATCH Score Latch: Grasps breast easily, tongue down, lips flanged, rhythmical sucking.  Audible Swallowing: Spontaneous and intermittent  Type of Nipple: Flat  Comfort (Breast/Nipple): Soft / non-tender  Hold (Positioning): Assistance needed to correctly position infant at breast and maintain latch.  LATCH Score: 8  Interventions Interventions: Breast feeding basics reviewed;Support pillows;Assisted with latch;Position options;Skin to skin;Breast massage;Hand express;Breast compression;Adjust position  Lactation Tools Discussed/Used WIC Program: Yes   Consult Status Consult Status: Follow-up Date: 02/02/18 Follow-up type: In-patient    Charyl Dancer 02/01/2018, 6:14 AM

## 2018-02-01 NOTE — Lactation Note (Signed)
This note was copied from a baby's chart. Lactation Consultation Note  Patient Name: Wendy Williamson UXLKG'M Date: 02/01/2018 Reason for consult: Follow-up assessment;Term;Primapara;1st time breastfeeding  89 hours old female who is being exclusively BF by his mother, she's a P1. Baby was laying in bassinet and sucking vigorously on a pacifier when entering the room, educated parents about pacifier use before BF is well established but they voiced the pacifier is "helping" with the latch and that baby wasn't showing feeding cues or or signs of hunger. Offered assistance with latch but dad voiced "he just fed" and showed LC baby's feeding diary. Baby fed at 8:30 pm for 30'. Asked parents to call for latch assistance if needed.  Per mom feeding at the breast are somehow comfortable and she's able to hear swallows. Mom is complaining of sore nipples, noticed that her right nipples has a little scab upon examination, the left one still looks intact. Mom wasn't wearing the shells with her nursing bra, she said she "just took them off". Reminded mom to keep wearing the shells and pre-pump prior feeding in order to evert the nipple to achieve a better latch. Tips for a deeper latch were discussed.  Encouraged mom to keep feeding baby STS 8-12 times/24 hours or sooner if feeding cues are present. Both parents aware of LC services and will call PRN.    Maternal Data    Feeding Feeding Type: Breast Fed  Interventions Interventions: Breast feeding basics reviewed;Coconut oil;Hand pump  Lactation Tools Discussed/Used Tools: Shells;Pump;Coconut oil Breast pump type: Manual Pump Review: Setup, frequency, and cleaning Initiated by:: RN   Consult Status Consult Status: Follow-up Date: 02/02/18 Follow-up type: In-patient    Lash Matulich Venetia Constable 02/01/2018, 8:49 PM

## 2018-02-02 MED ORDER — IBUPROFEN 600 MG PO TABS: 600.0000 mg | ORAL_TABLET | Freq: Four times a day (QID) | ORAL | 0 refills | Status: AC | PRN

## 2018-02-02 MED ORDER — FERROUS FUMARATE 324 (106 FE) MG PO TABS: 1.0000 | ORAL_TABLET | Freq: Two times a day (BID) | ORAL | 3 refills | Status: AC

## 2018-02-02 MED ORDER — OXYCODONE HCL 5 MG PO TABS: 5.0000 mg | ORAL_TABLET | Freq: Four times a day (QID) | ORAL | 0 refills | Status: AC | PRN

## 2018-02-02 NOTE — Discharge Summary (Addendum)
OB Discharge Summary     Patient Name: Wendy Williamson DOB: December 28, 1990 MRN: [id number] Date of admission: 01/31/2018  Delivering MD: [place] Bing )  Date of discharge: 02/02/2018    Admitting diagnosis: WATER BROKE Intrauterine pregnancy: [redacted]w[redacted]d    Secondary diagnosis:  Active Problems:   Patient Active Problem List   Diagnosis Date Noted  . Malpresentation before onset of labor 01/31/2018  . GBS (group B Streptococcus carrier), +RV culture, currently pregnant 01/11/2018  . Supervision of high risk pregnancy, antepartum 10/10/2017  . Short cervix during pregnancy in second trimester 10/06/2017    Additional problems: None     Discharge diagnosis: Term Pregnancy Delivered and Anemia                                                                                                Post partum procedures:None  Complications: None  Hospital course:  Onset of Labor With Unplanned C/S  27 y.o. yo G2P1011 at [redacted]w[redacted]d was admitted in Active Labor on 01/31/2018. Patient had a labor course significant GBS+ and hx of short cervix. Membrane Rupture Time/Date: 6:00 AM ,01/31/2018   The patient went for cesarean section due to frank breech presentation, and delivered a Viable infant,01/31/2018  Details of operation can be found in separate operative note. Patient had an uncomplicated postpartum course.  She is ambulating,tolerating a regular diet, passing flatus, and urinating well.  Patient is discharged home in stable condition 02/02/18 per her request for early d/c.  Physical exam  Vitals:   02/01/18 1754 02/02/18 0613  BP: 113/67 111/71  Pulse: 64 69  Resp: 17 18  Temp: 98 F (36.7 C) 98.4 F (36.9 C)  SpO2:      General: alert, cooperative and no distress Lochia: appropriate Uterine Fundus: firm Incision: Healing well with no significant drainage, No significant erythema, Dressing is clean, dry, and intact DVT Evaluation: No evidence of DVT seen on physical exam.  Labs: No  results found for this or any previous visit (from the past 24 hour(s)).   Discharge instruction: per After Visit Summary and "Baby and Me Booklet".  After visit meds:  No Known Allergies  Allergies as of 02/02/2018   No Known Allergies     Medication List    STOP taking these medications   acetaminophen 500 MG tablet Commonly known as:  TYLENOL     TAKE these medications   Ferrous Fumarate 324 (106 Fe) MG Tabs tablet Commonly known as:  HEMOCYTE - 106 mg FE Take 1 tablet (106 mg of iron total) by mouth 2 (two) times daily.   ibuprofen 600 MG tablet Commonly known as:  ADVIL,MOTRIN Take 1 tablet (600 mg total) by mouth every 6 (six) hours as needed for mild pain or cramping.   oxyCODONE 5 MG immediate release tablet Commonly known as:  Oxy IR/ROXICODONE Take 1 tablet (5 mg total) by mouth every 6 (six) hours as needed (pain scale 4-7).   prenatal multivitamin Tabs tablet Take 1 tablet by mouth daily at 12 noon.        Diet: routine diet  Activity: Advance as tolerated.  Pelvic rest for 6 weeks.   Outpatient follow up:2 weeks for incision check, then 4-6 wks for PP visit Future Appointments:  Future Appointments  Date Time Provider Department Center  02/20/2018 10:15 AM [facility] NURSE [facility]    Follow up Appt: No follow-ups on file.   Postpartum contraception: Progesterone only pills  Newborn Data: APGAR (1 MIN): 7   APGAR (5 MINS): 9    Baby Feeding: Breast Disposition:home with mother  Alfonso Ellis, Medical Student  02/02/2018  CNM attestation I have seen and examined this patient and agree with above documentation in the med student's note.   Wendy Williamson is a 27 y.o. G2P1011 s/p pLTCS for frank breech presentation in active labor.   Pain is well controlled.  Plan for birth control is oral progesterone-only contraceptive.  Method of Feeding: breast  Denies dizziness or concerns with ambulation  PE:  BP 111/71 (BP Location: Right Arm)    Pulse 69   Temp 98.4 F (36.9 C) (Oral)   Resp 18   Ht  (1.549 m)   Wt 80.2 kg (176 lb 12 oz)   LMP 04/29/2017   SpO2 99%   Breastfeeding? Unknown   BMI 33.40 kg/m  Fundus firm  Recent Labs    01/31/18 0830 02/01/18 0654  HGB 11.5* 8.8*  HCT 34.2* 25.8*     Plan: discharge today - postpartum care discussed - FeSO4 for anemia - f/u clinic in 2 weeks for incision check, then 4-6 for postpartum visit   Cam Hai, CNM 8:49 AM 02/02/2018

## 2018-02-02 NOTE — Discharge Instructions (Signed)

## 2018-02-02 NOTE — Lactation Note (Addendum)
This note was copied from a baby's chart. Lactation Consultation Note  Patient Name: Wendy Williamson Today's Date: 02/02/2018   No stool in greater than 24 hours, but infant is nursing very well. Mom's breasts are filling & infant's swallows are now constant (swallows were initially verified by cervical auscultation, but swallows are now clearly audible to the naked ear). Infant is cluster feeding, but likely taking advantage of Mom's increasing volume. I expect that a transitional stool will be forthcoming soon.   Mom has [place]. A pump kit was supplied & parents know how to assemble & use hand pump (single & dual mode) that was included in pump kit. Mom has a mild compression stripe on her L nipple. Mom is comfortable w/latches (after initial brief tenderness).  Infant noted to have excellent tongue mobility.   Matthias Hughs [facility] 02/02/2018, 1:09 PM

## 2018-02-03 ENCOUNTER — Encounter: Payer: Medicaid Other | Admitting: Obstetrics and Gynecology

## 2018-02-05 ENCOUNTER — Inpatient Hospital Stay (HOSPITAL_COMMUNITY): Admission: RE | Admit: 2018-02-05 | Payer: Medicaid Other | Source: Ambulatory Visit

## 2018-02-07 ENCOUNTER — Encounter: Payer: Self-pay | Admitting: *Deleted

## 2018-02-07 ENCOUNTER — Encounter: Payer: Medicaid Other | Admitting: Obstetrics and Gynecology

## 2018-02-20 ENCOUNTER — Ambulatory Visit (INDEPENDENT_AMBULATORY_CARE_PROVIDER_SITE_OTHER): Payer: Medicaid Other

## 2018-02-20 VITALS — BP 100/63 | HR 89

## 2018-02-20 DIAGNOSIS — Z5189 Encounter for other specified aftercare: Secondary | ICD-10-CM

## 2018-02-20 NOTE — Progress Notes (Signed)
Pt here today for incision check s/p c-section 3 weeks ago.  Pt denies any pain or discomfort.  Pt incision healing well, well approximated, no odor, and no drainage.  Advised pt to continue to monitor for infection and that we will see her at her pp visit scheduled on 03/15/18.  Pt verbalized understanding with no further questions.

## 2018-02-20 NOTE — Progress Notes (Signed)
I have reviewed the chart and agree with nursing staff's documentation of this patient's encounter.  Thressa ShellerHeather Reesa Gotschall, CNM 02/20/2018 10:42 AM

## 2018-03-15 ENCOUNTER — Encounter: Payer: Self-pay | Admitting: Obstetrics and Gynecology

## 2018-03-15 ENCOUNTER — Ambulatory Visit (INDEPENDENT_AMBULATORY_CARE_PROVIDER_SITE_OTHER): Payer: Medicaid Other | Admitting: Obstetrics and Gynecology

## 2018-03-15 ENCOUNTER — Other Ambulatory Visit (HOSPITAL_COMMUNITY)
Admission: RE | Admit: 2018-03-15 | Discharge: 2018-03-15 | Disposition: A | Discharge status: Home / Self Care | Payer: Medicaid Other | Source: Ambulatory Visit | Attending: Obstetrics and Gynecology | Admitting: Obstetrics and Gynecology

## 2018-03-15 VITALS — BP 103/89 | HR 89 | Wt 147.0 lb

## 2018-03-15 DIAGNOSIS — Z124 Encounter for screening for malignant neoplasm of cervix: Secondary | ICD-10-CM | POA: Insufficient documentation

## 2018-03-15 DIAGNOSIS — N905 Atrophy of vulva: Secondary | ICD-10-CM | POA: Diagnosis not present

## 2018-03-15 DIAGNOSIS — Z1389 Encounter for screening for other disorder: Secondary | ICD-10-CM | POA: Diagnosis not present

## 2018-03-15 DIAGNOSIS — N898 Other specified noninflammatory disorders of vagina: Secondary | ICD-10-CM

## 2018-03-15 DIAGNOSIS — N941 Unspecified dyspareunia: Secondary | ICD-10-CM | POA: Diagnosis not present

## 2018-03-15 MED ORDER — NORETHINDRONE 0.35 MG PO TABS
1.0000 | ORAL_TABLET | Freq: Every day | ORAL | 3 refills | Status: AC
Start: 2018-03-15 — End: ?

## 2018-03-15 NOTE — Progress Notes (Signed)
Obstetrics and Gynecology Postpartum Visit  Appointment Date: 03/15/2018  [facility]  Primary Care Provider: Patient, No Pcp Per  Chief Complaint:  Chief Complaint  Patient presents with  . Postpartum Care  . Contraception    History of Present Illness: Clemon ChambersSamantha Oceguera is a 27 y.o. Caucasian G2P1011 (LMP: ? Last week), seen for the above chief complaint. Her past medical history is significant for nothing  She is s/p pLTCS on 5/21 for term labor and breech at 40/1; she was discharged to home on POD#2  Vaginal bleeding or discharge: No  Breast or formula feeding: started formula only about two weeks ago Intercourse: tried two weeks but too uncomfortable Contraception after delivery: No  PP depression s/s: No  Any bowel or bladder issues: No  Pap smear: none  Review of Systems:  as noted in the History of Present Illness.  Medications PNV  Allergies Patient has no known allergies.  Physical Exam:  BP 103/89   Pulse 89   Wt 147 lb (66.7 kg)   Breastfeeding? No   BMI 27.78 kg/m  Body mass index is 27.78 kg/m. General appearance: Well nourished, well developed female in no acute distress.  Respiratory:  Normal respiratory effort Abdomen: soft, nttp, nd. Well healed LT skin incision Neuro/Psych:  Normal mood and affect.  Skin:  Warm and dry.  Lymphatic:  No inguinal lymphadenopathy.   Pelvic exam: is not limited by body habitus EGBUS: within normal limits, mild atrophy and vaginal dryness Vagina: within normal limits and with no blood in the vault, Cervix:  no lesions or cervical motion tenderness Uterus:  nonenlarged and approximately 8 week sized Adnexa:  normal adnexa and no mass, fullness, tenderness Rectovaginal: deferred  Laboratory: none  PP Depression Screening:  EPDS 2  Assessment: pt doing well  Plan:  Routine care. Pap today. Pt used POPs before and would like them again. Rx sent in and rediscussed with her re:  how to use and that a better formulation may be out later this year  D/w her that still having some atrophy from the breast feeding but should get better with time. Encouraged lubrication use and to let us know if it persists after 4wks from now  RTC PRN  Cornelia Copaharlie Pickens, Jr MD Attending [facility](Faculty Practice)

## 2018-03-17 LAB — CYTOLOGY - PAP: Diagnosis: NEGATIVE

## 2018-04-03 ENCOUNTER — Encounter: Payer: Self-pay | Admitting: General Practice

## 2018-04-03 ENCOUNTER — Encounter: Payer: Self-pay | Admitting: Obstetrics and Gynecology

## 2018-04-04 ENCOUNTER — Encounter: Payer: Self-pay | Admitting: Obstetrics and Gynecology

## 2019-03-10 IMAGING — US US MFM OB TRANSVAGINAL
1 series · 15 of 28 positions shown · non-contrast
Comparison: none

[Series 1: us mfm ob transvaginal · 51 acquisitions, 15 frames shown]
[im 1/51]
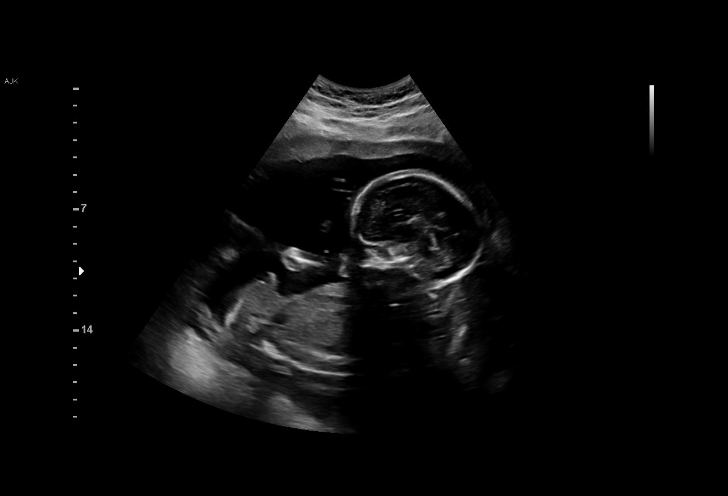
[im 4/51]
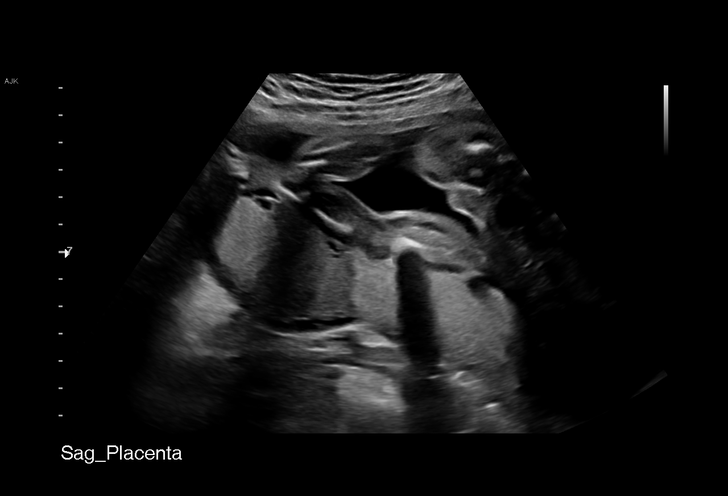
[im 8/51]
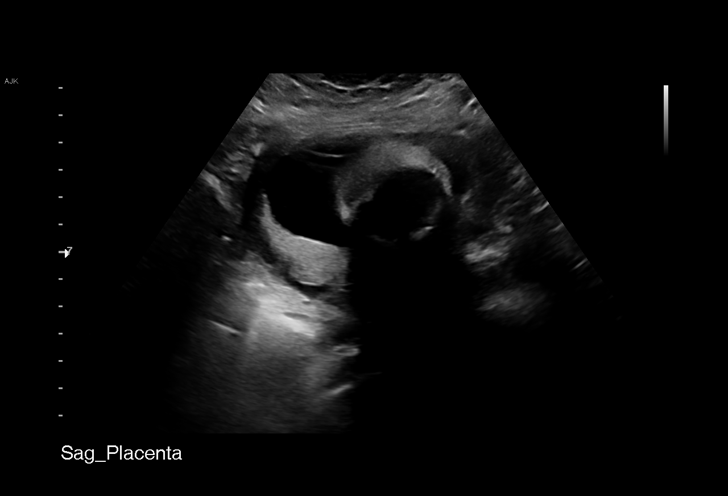
[im 12/51]
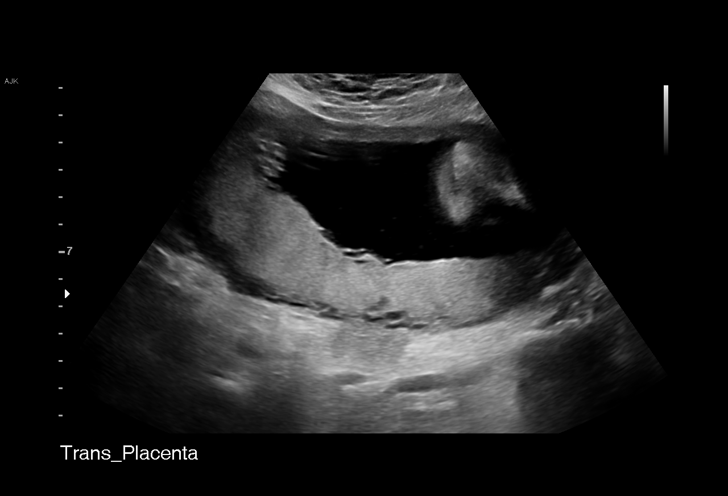
[im 15/51]
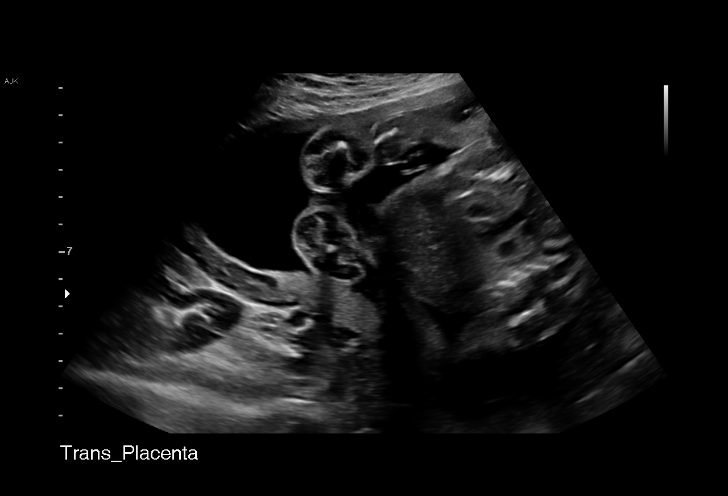
[im 19/51]
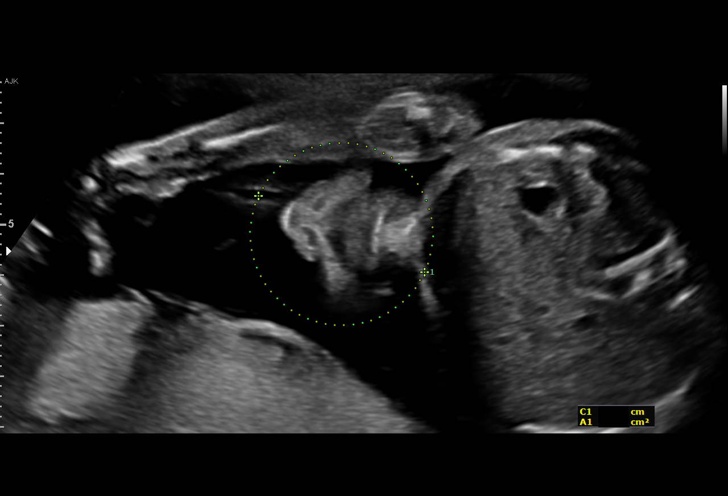
[im 23/51]
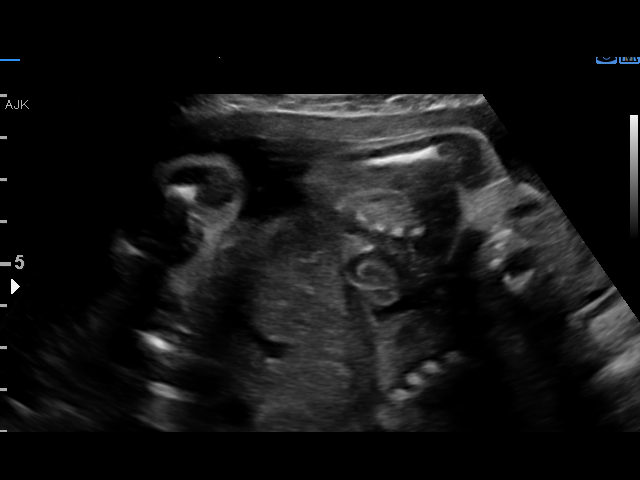
[im 26/51]
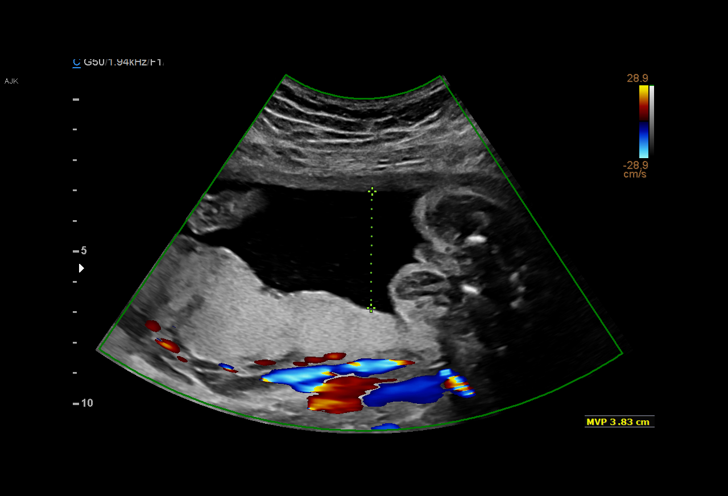
[im 28/51]
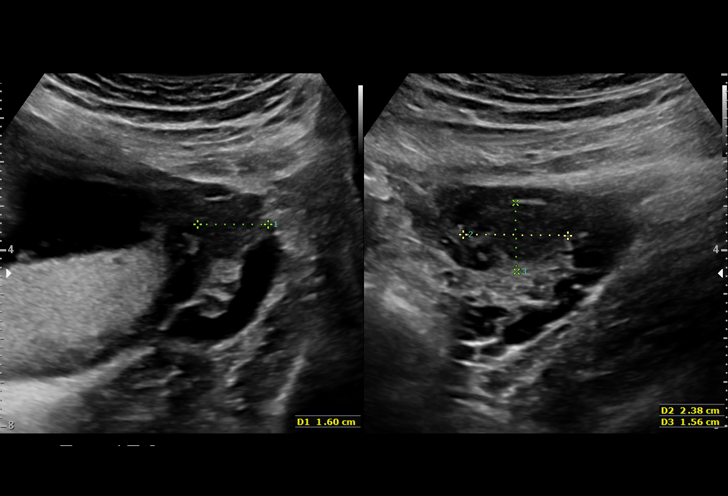
[im 32/51]
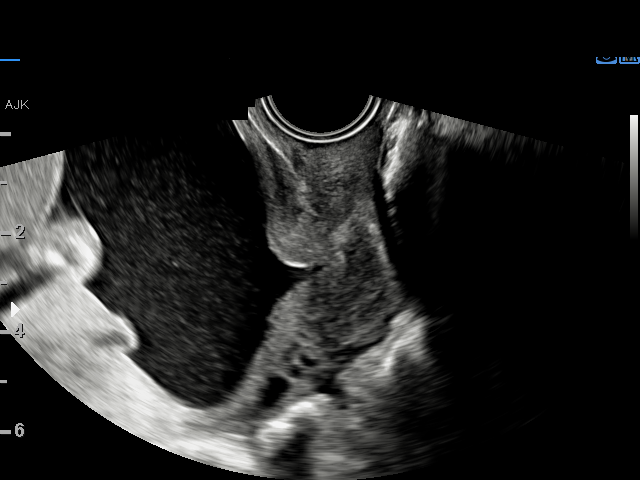
[im 36/51]
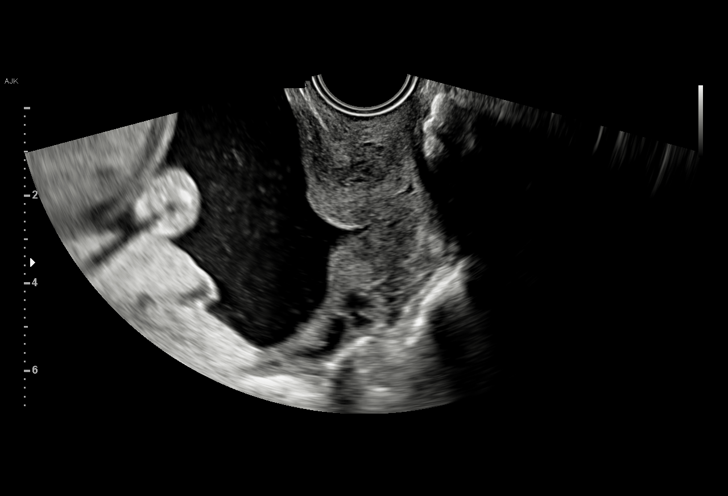
[im 39/51]
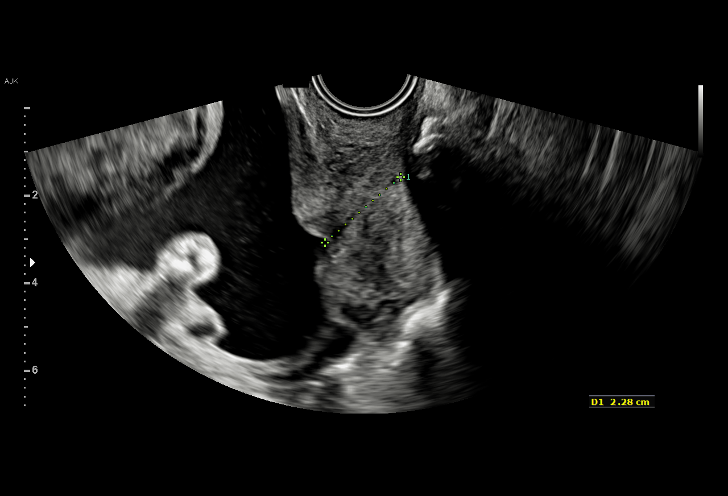
[im 43/51]
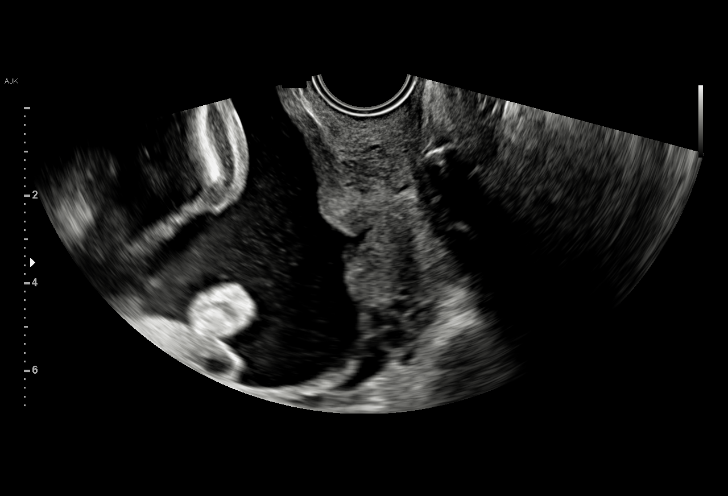
[im 47/51]
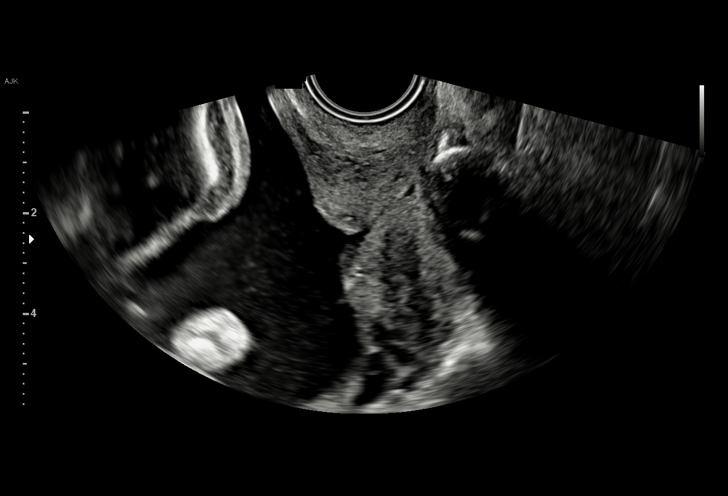
[im 51/51]
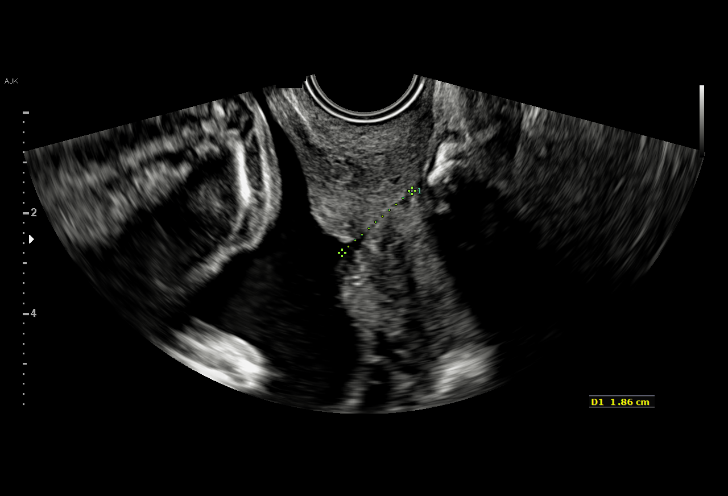

[15 of 28 positions shown; findings below may reference images not displayed]

Indications

23 weeks gestation of pregnancy
Cervical shortening, second trimester
OB History

Gravidity:    2         Term:   0
TOP:          1        Living:  0
Fetal Evaluation

Num Of Fetuses:     1
Fetal Heart         150
Rate(bpm):
Cardiac Activity:   Observed
Presentation:       Transverse, head to maternal left
Placenta:           Posterior, above cervical os
P. Cord Insertion:  Visualized, central

Amniotic Fluid
AFI FV:      Subjectively within normal limits

Largest Pocket(cm)
3.83

Comment:    Stomach, bladder, and diaphragm visualized.
Gestational Age

Clinical EDD:  23w 2d                                        EDD:   01/30/18
Best:          23w 2d     Det. By:  Clinical EDD             EDD:   01/30/18
Cervix Uterus Adnexa
Cervix
Length:           1.86  cm.
Measured transvaginally.

Uterus
No abnormality visualized.

Left Ovary
Within normal limits.

Right Ovary
Within normal limits.

Adnexa:       No abnormality visualized.
Impression

SIUP at 56w5d
active fetus
no previa
amniotic fluid volume is gestational age appropriate
cervix is short, measuring 1.86 cm
Recommendations

Recommend Sammy 200mg pv qhs
Repeat cervical length by TVUS in 1 week
Labor precautions in the interim

## 2019-03-26 IMAGING — US US MFM OB TRANSVAGINAL
1 series · 16 of 28 positions shown · non-contrast
Comparison: none

[Series 1: us mfm ob transvaginal · 39 acquisitions, 16 frames shown]
[im 1/39]
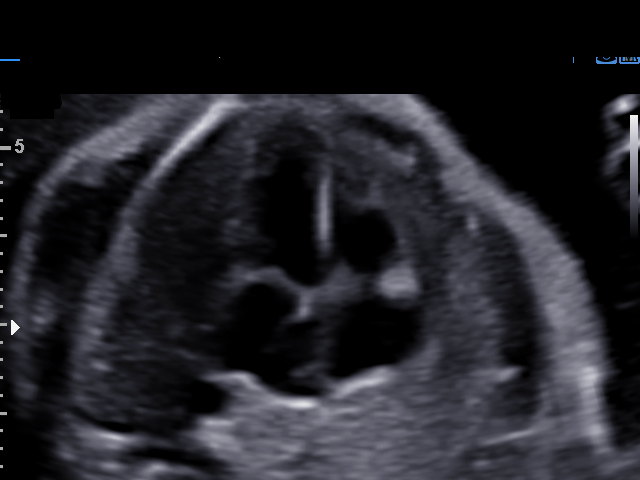
[im 3/39]
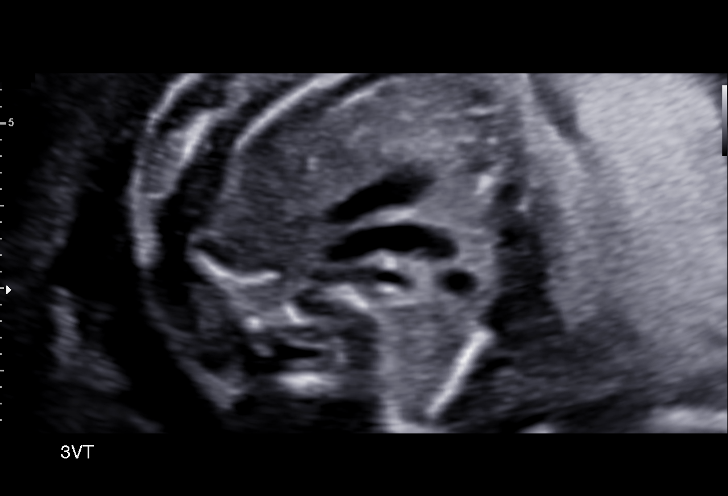
[im 6/39]
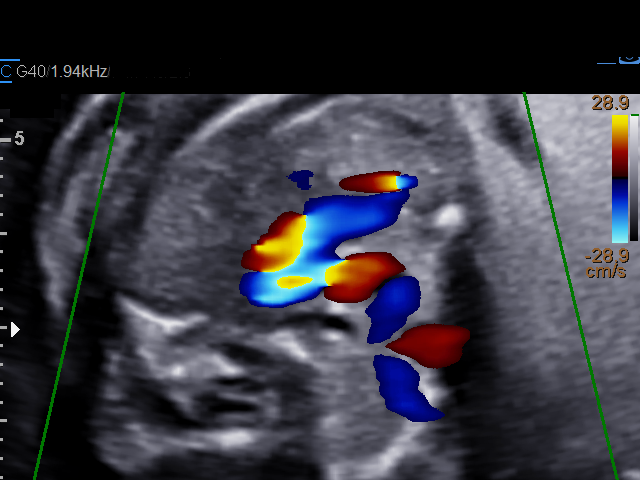
[im 9/39]
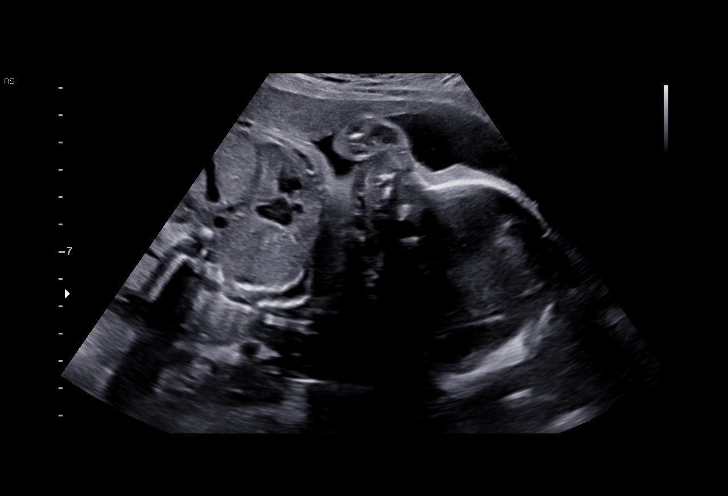
[im 10/39]
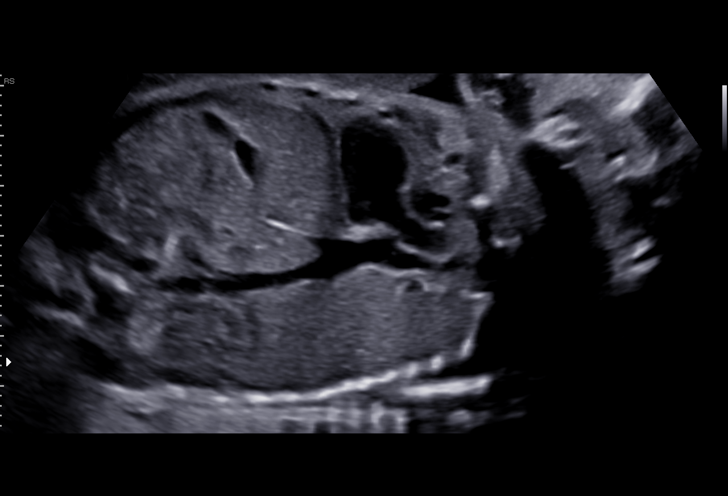
[im 13/39]
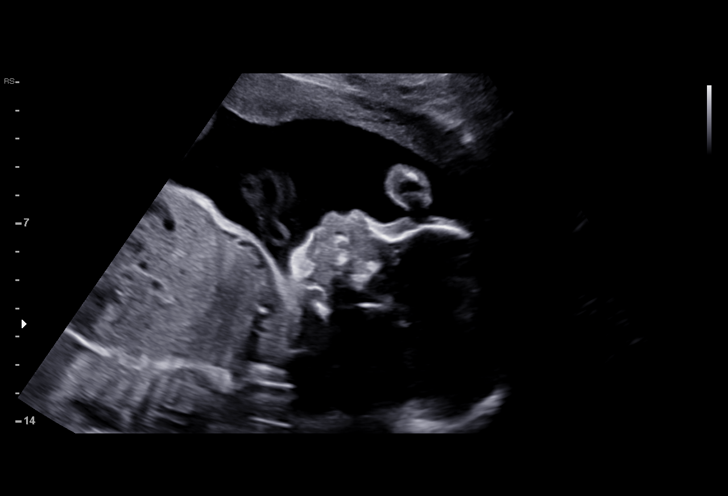
[im 16/39]
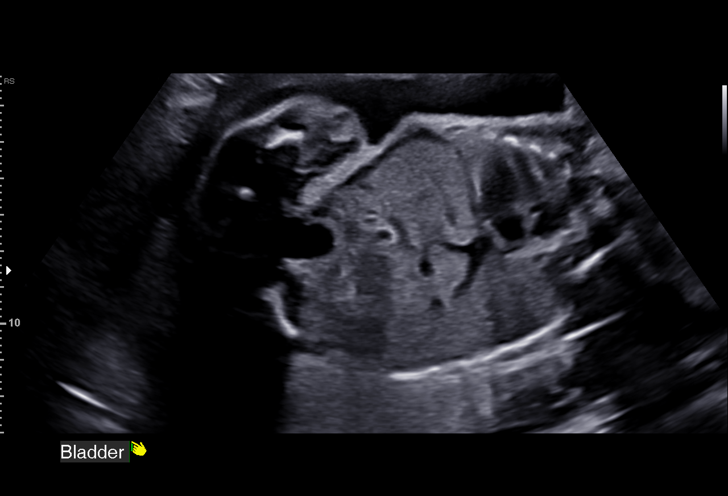
[im 19/39]
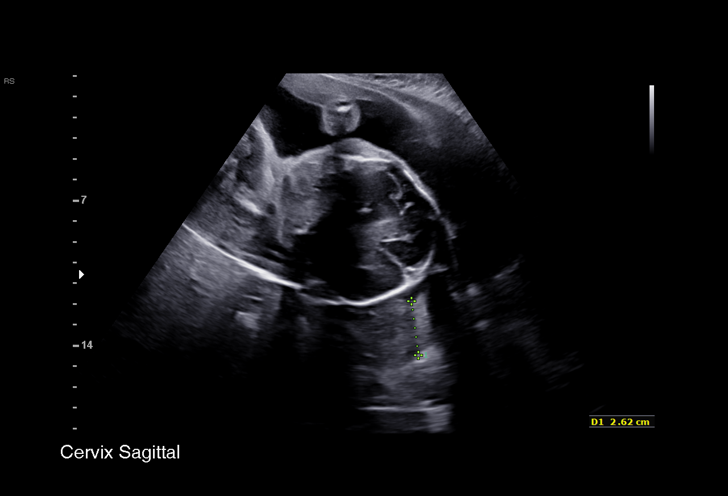
[im 20/39]
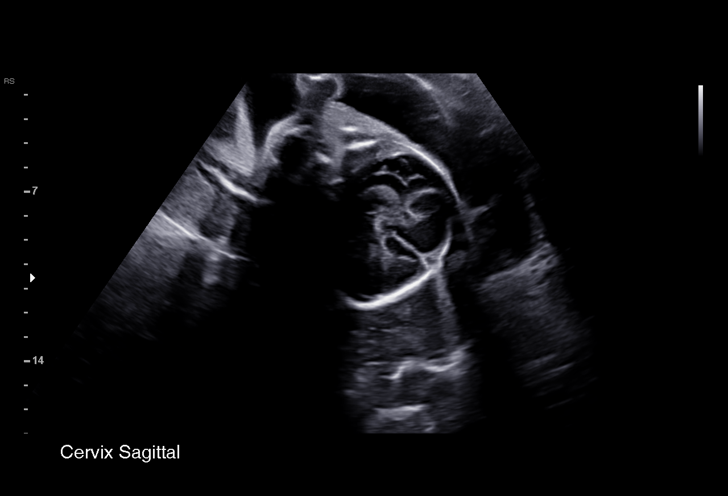
[im 23/39]
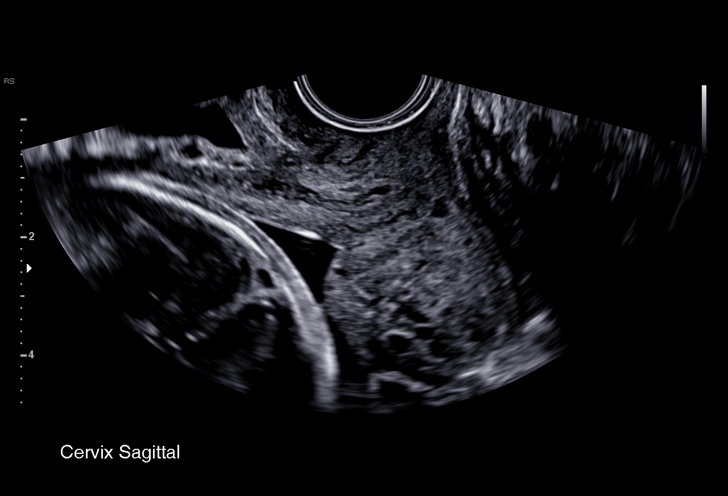
[im 26/39]
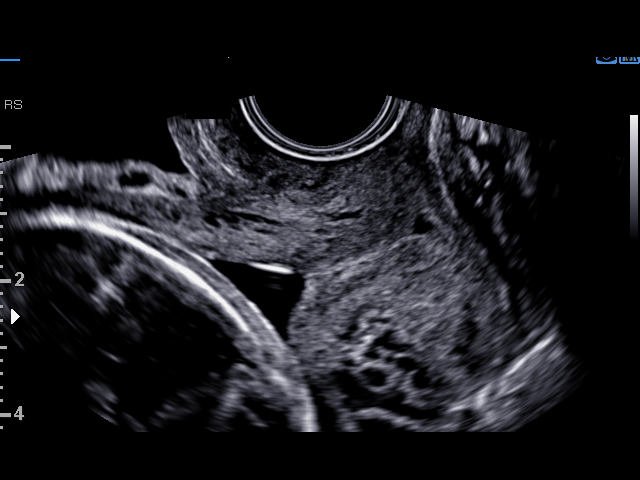
[im 29/39]
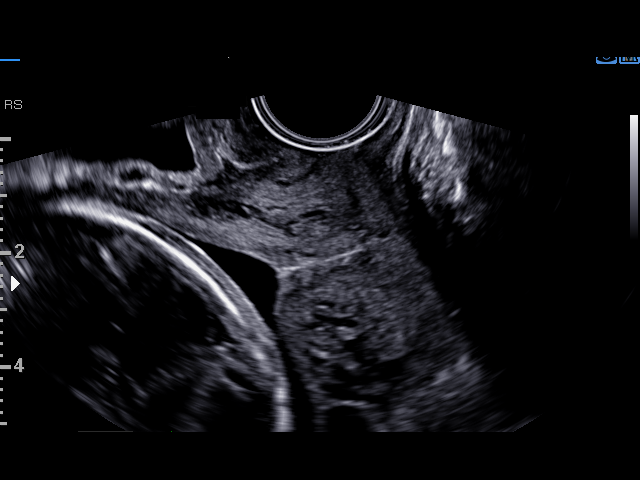
[im 30/39]
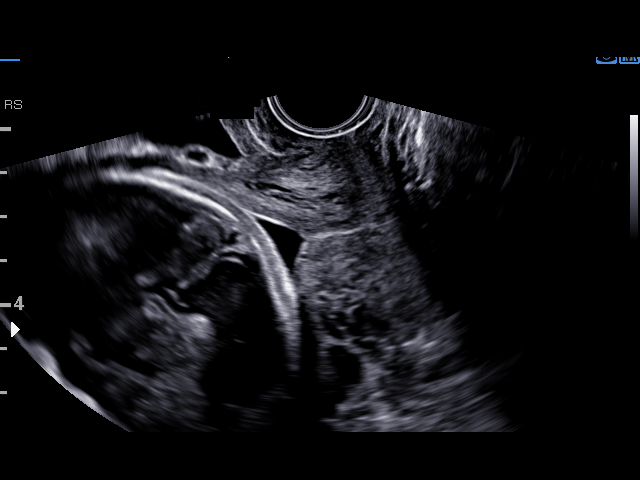
[im 33/39]
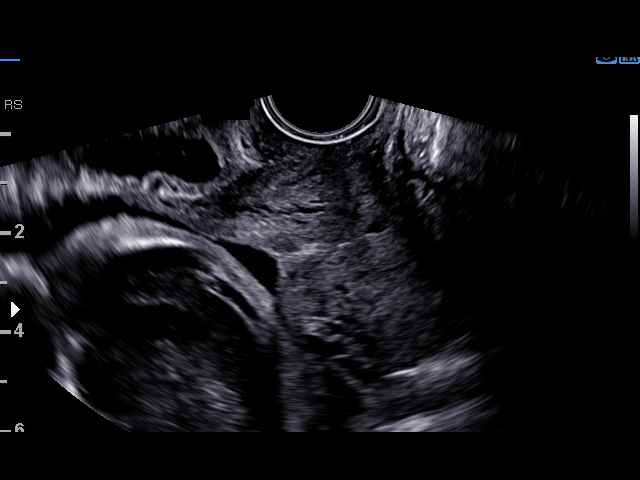
[im 36/39]
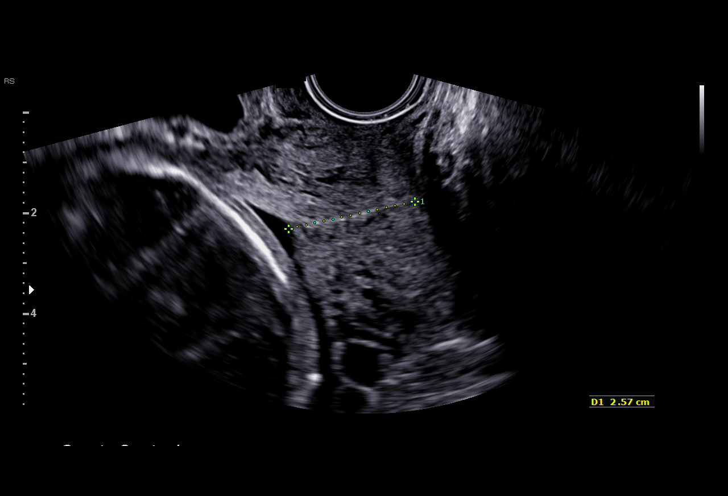
[im 39/39]
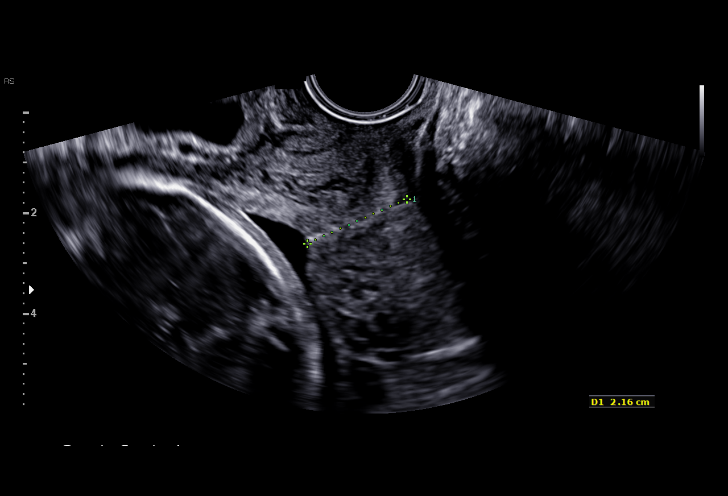

[16 of 28 positions shown; findings below may reference images not displayed]

OB/Gyn Clinic
[REDACTED]

1  [facility] POL[facility]            [id number]      [id number]     [id number]
Indications

25 weeks gestation of pregnancy
Encounter for cervical length
Cervical shortening, second trimester
OB History

Gravidity:    2         Term:   0
TOP:          1        Living:  0
Fetal Evaluation

Num Of Fetuses:     1
Fetal Heart         131
Rate(bpm):
Cardiac Activity:   Observed
Presentation:       Cephalic

Amniotic Fluid
AFI FV:      Subjectively within normal limits

Largest Pocket(cm)
6.64
Gestational Age

Clinical EDD:  25w 4d                                        EDD:   01/30/18
Best:          25w 4d     Det. By:  Clinical EDD             EDD:   01/30/18
Anatomy

Heart:                 Appears normal         Kidneys:                Appear normal
(4CH, axis, and situs
Stomach:               Appears normal, left   Bladder:                Appears normal
sided
Abdomen:               Appears normal
Cervix Uterus Adnexa

Cervix
Length:              2  cm.
Measured transvaginally.
Impression

Single living intrauterine pregnancy at 25w 4d.
Cephalic presentation.
Normal amniotic fluid volume.
Normal interval fetal anatomy.
The cervix measures 
2 cm transvaginally without funneling
or debris.
Recommendations

Continue weekly CL, may stop with next one if stable

## 2019-04-02 IMAGING — US US MFM OB TRANSVAGINAL
1 series · 16 of 28 positions shown · non-contrast
Comparison: none

[Series 1: us mfm ob transvaginal · 32 acquisitions, 16 frames shown]
[im 1/32]
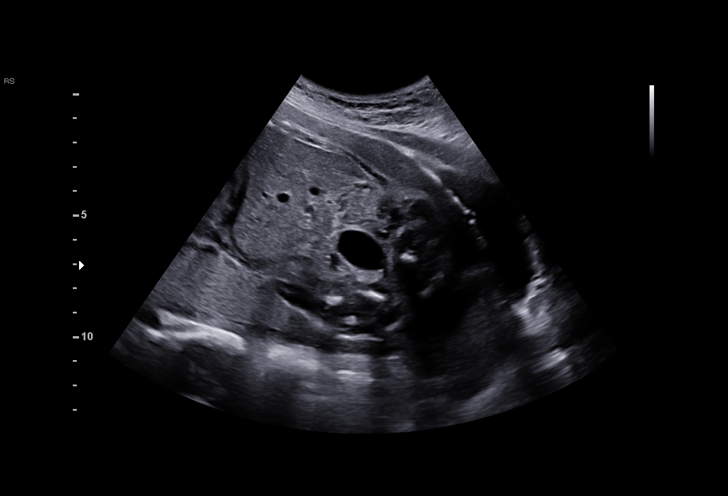
[im 3/32]
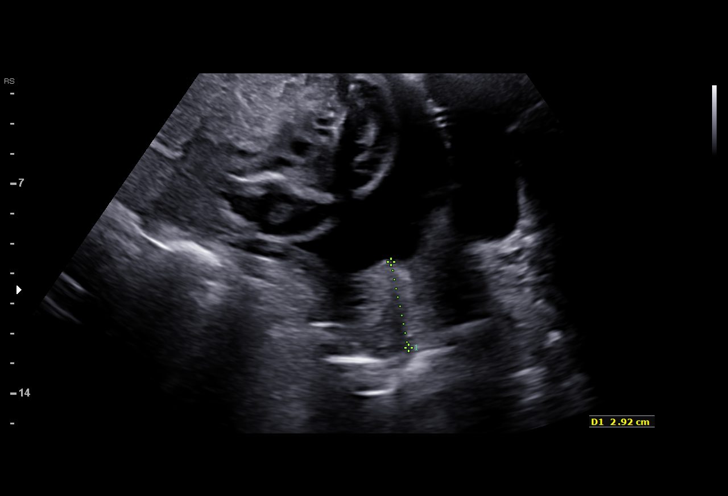
[im 5/32]
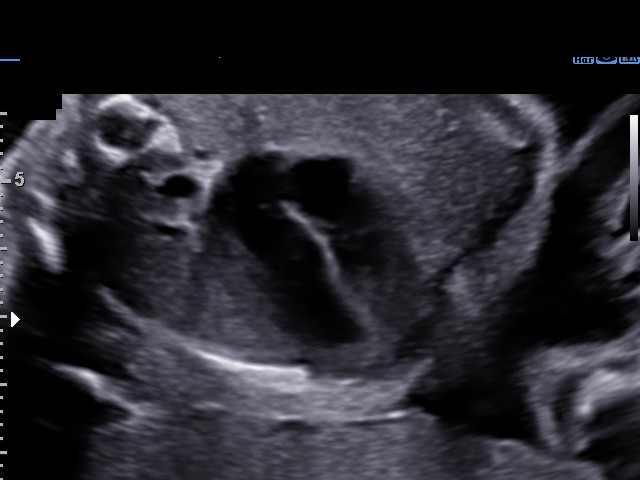
[im 7/32]
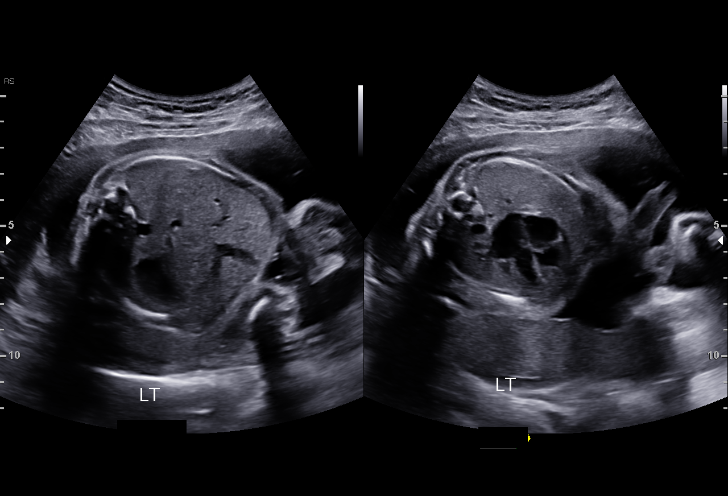
[im 9/32]
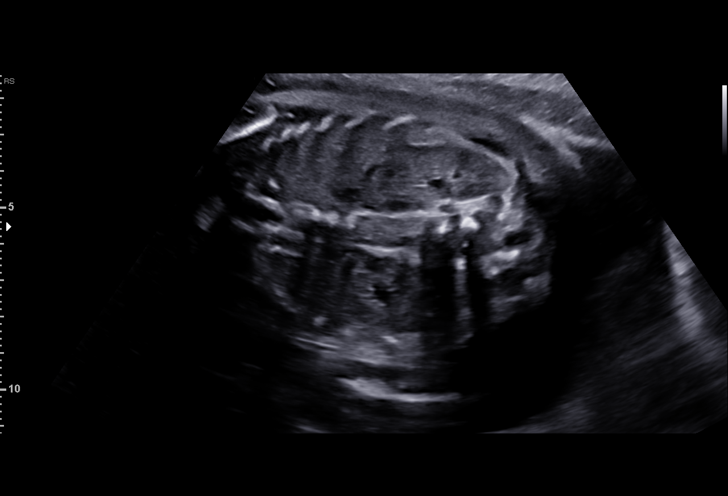
[im 11/32]
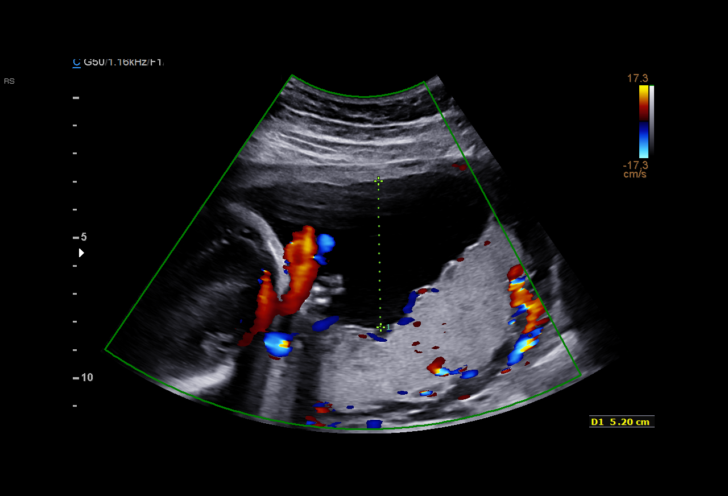
[im 13/32]
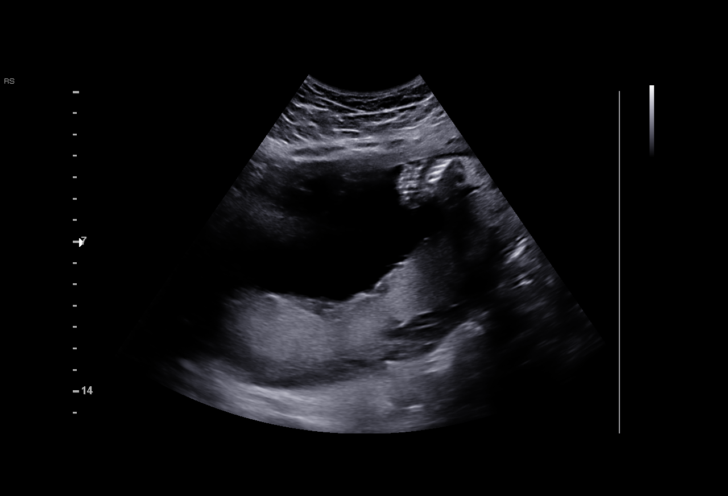
[im 15/32]
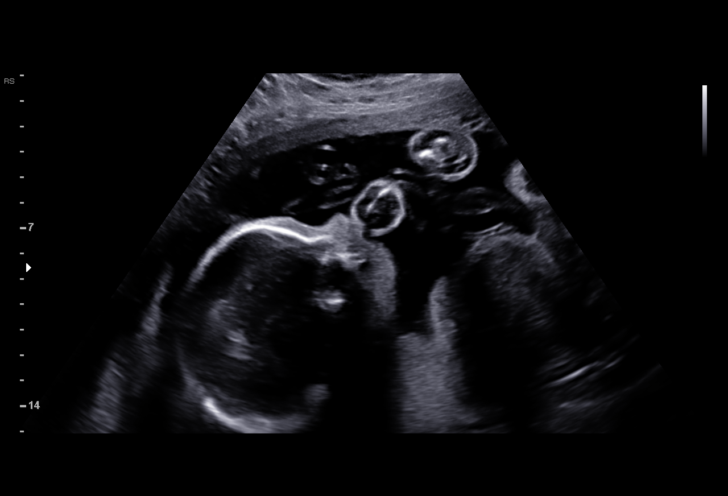
[im 17/32]
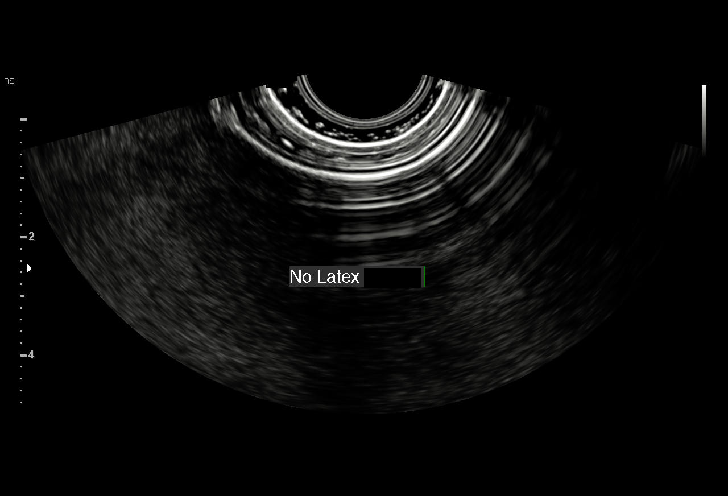
[im 19/32]
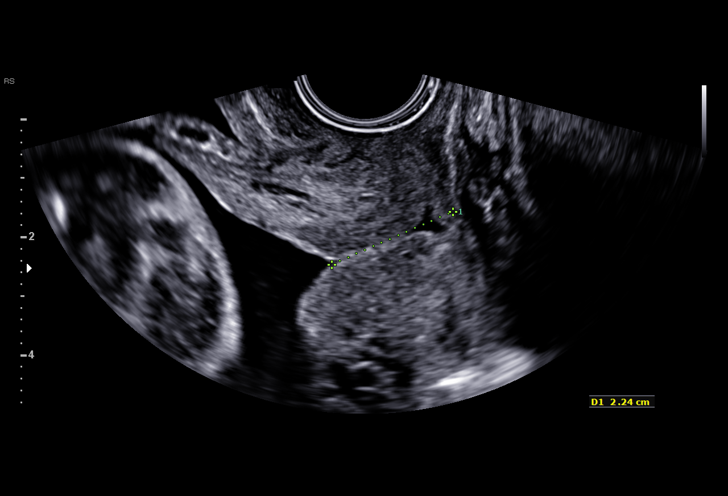
[im 21/32]
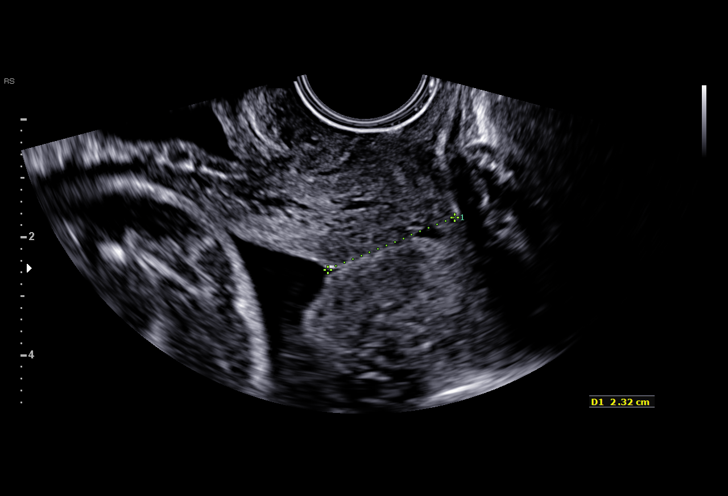
[im 23/32]
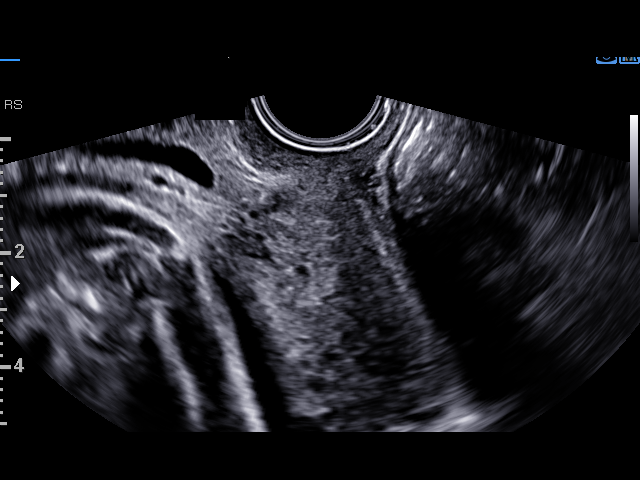
[im 25/32]
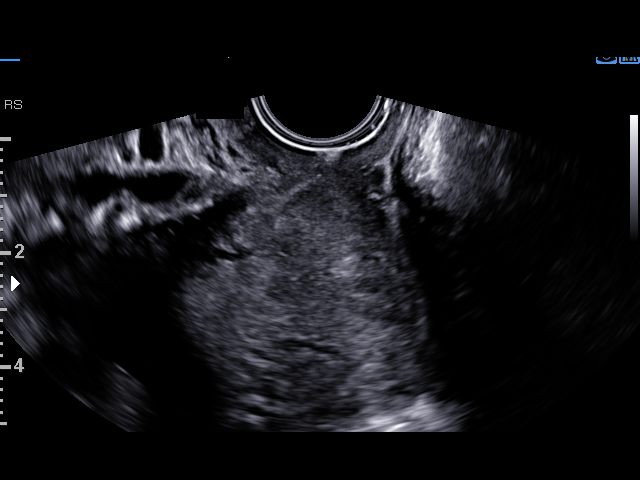
[im 27/32]
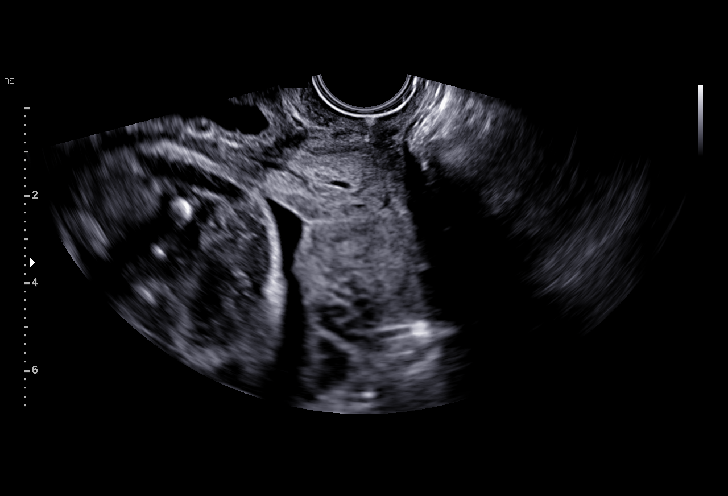
[im 29/32]
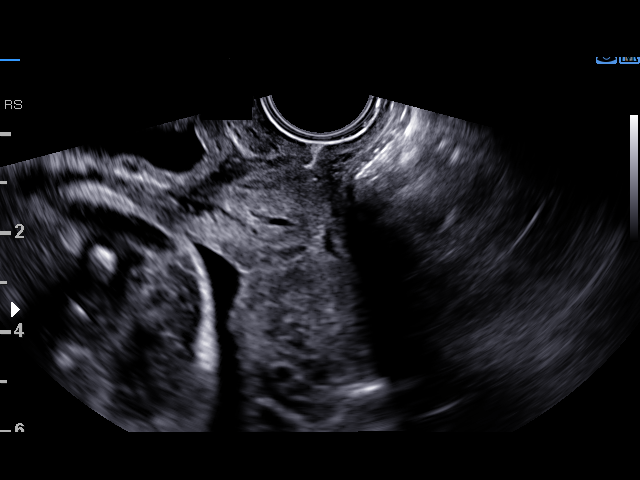
[im 32/32]
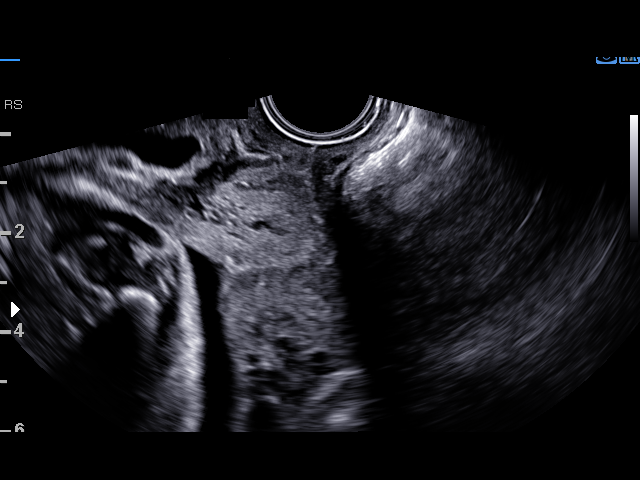

[16 of 28 positions shown; findings below may reference images not displayed]

OB/Gyn Clinic
[REDACTED]

1  RYAN KENNETH PALLARICO            [id number]      [id number]     [id number]
Indications

26 weeks gestation of pregnancy
Encounter for cervical length
Cervical shortening, second trimester;
vaginal progesterone
OB History

Gravidity:    2         Term:   0
TOP:          1        Living:  0
Fetal Evaluation

Num Of Fetuses:     1
Fetal Heart         138
Rate(bpm):
Cardiac Activity:   Observed
Presentation:       Breech
Placenta:           Posterior, above cervical os

Amniotic Fluid
AFI FV:      Subjectively within normal limits

Largest Pocket(cm)
5.20
Gestational Age

Clinical EDD:  26w 4d                                        EDD:   01/30/18
Best:          26w 4d     Det. By:  Clinical EDD             EDD:   01/30/18
Anatomy

Thoracic:              Appears normal         Kidneys:                Appear normal
Stomach:               Appears normal, left   Bladder:                Appears normal
sided
Cervix Uterus Adnexa

Cervix
Measured transvaginally.
Impression

SIUP at 26+4 weeks with cardiac activity
Normal amniotic fluid volume
EV views of cervix: funneling with distal closed portion
measuring 1.5 cms
Recommendations

Will give course of BMZ
CL and growth in 2 weeks
Continue vaginal progesterone
